# Patient Record
Sex: Male | Born: 1970 | Hispanic: No | Marital: Married | Smoking: Never smoker
Health system: Southern US, Community
[De-identification: ages and names within clinical notes are randomized; demographics above are authoritative.]

## PROBLEM LIST (undated history)

## (undated) ENCOUNTER — Ambulatory Visit (HOSPITAL_COMMUNITY): Admission: EM | Payer: Medicaid Other

## (undated) DIAGNOSIS — N400 Enlarged prostate without lower urinary tract symptoms: Secondary | ICD-10-CM

## (undated) DIAGNOSIS — B372 Candidiasis of skin and nail: Secondary | ICD-10-CM

## (undated) DIAGNOSIS — I10 Essential (primary) hypertension: Secondary | ICD-10-CM

## (undated) DIAGNOSIS — K920 Hematemesis: Secondary | ICD-10-CM

## (undated) DIAGNOSIS — E119 Type 2 diabetes mellitus without complications: Secondary | ICD-10-CM

## (undated) DIAGNOSIS — M5116 Intervertebral disc disorders with radiculopathy, lumbar region: Secondary | ICD-10-CM

## (undated) DIAGNOSIS — N529 Male erectile dysfunction, unspecified: Secondary | ICD-10-CM

## (undated) DIAGNOSIS — R350 Frequency of micturition: Secondary | ICD-10-CM

## (undated) DIAGNOSIS — Z0289 Encounter for other administrative examinations: Secondary | ICD-10-CM

## (undated) HISTORY — DX: Encounter for other administrative examinations: Z02.89

## (undated) HISTORY — DX: Intervertebral disc disorders with radiculopathy, lumbar region: M51.16

## (undated) HISTORY — DX: Benign prostatic hyperplasia without lower urinary tract symptoms: N40.0

## (undated) HISTORY — DX: Frequency of micturition: R35.0

## (undated) HISTORY — DX: Candidiasis of skin and nail: B37.2

## (undated) HISTORY — DX: Hematemesis: K92.0

## (undated) HISTORY — DX: Male erectile dysfunction, unspecified: N52.9

---

## 2015-08-05 ENCOUNTER — Emergency Department (HOSPITAL_COMMUNITY): Payer: Medicaid Other

## 2015-08-05 ENCOUNTER — Emergency Department (HOSPITAL_COMMUNITY)
Admission: EM | Admit: 2015-08-05 | Discharge: 2015-08-05 | Disposition: A | Discharge status: Home / Self Care | Payer: Medicaid Other | Attending: Emergency Medicine | Admitting: Emergency Medicine

## 2015-08-05 ENCOUNTER — Encounter (HOSPITAL_COMMUNITY): Payer: Self-pay | Admitting: Emergency Medicine

## 2015-08-05 DIAGNOSIS — K920 Hematemesis: Secondary | ICD-10-CM | POA: Diagnosis present

## 2015-08-05 DIAGNOSIS — R1033 Periumbilical pain: Secondary | ICD-10-CM | POA: Insufficient documentation

## 2015-08-05 DIAGNOSIS — R05 Cough: Secondary | ICD-10-CM | POA: Insufficient documentation

## 2015-08-05 DIAGNOSIS — R52 Pain, unspecified: Secondary | ICD-10-CM

## 2015-08-05 DIAGNOSIS — R109 Unspecified abdominal pain: Secondary | ICD-10-CM

## 2015-08-05 LAB — CBC WITH DIFFERENTIAL/PLATELET
BASOS ABS: 0 10*3/uL (ref 0.0–0.1)
Basophils Relative: 0 % (ref 0–1)
Eosinophils Absolute: 0.1 10*3/uL (ref 0.0–0.7)
Eosinophils Relative: 2 % (ref 0–5)
HCT: 43.9 % (ref 39.0–52.0)
Hemoglobin: 14.5 g/dL (ref 13.0–17.0)
LYMPHS PCT: 26 % (ref 12–46)
Lymphs Abs: 2.2 10*3/uL (ref 0.7–4.0)
MCH: 27.4 pg (ref 26.0–34.0)
MCHC: 33 g/dL (ref 30.0–36.0)
MCV: 82.8 fL (ref 78.0–100.0)
Monocytes Absolute: 0.4 10*3/uL (ref 0.1–1.0)
Monocytes Relative: 5 % (ref 3–12)
NEUTROS PCT: 67 % (ref 43–77)
Neutro Abs: 5.6 10*3/uL (ref 1.7–7.7)
Platelets: 273 10*3/uL (ref 150–400)
RBC: 5.3 MIL/uL (ref 4.22–5.81)
RDW: 13.5 % (ref 11.5–15.5)
WBC: 8.3 10*3/uL (ref 4.0–10.5)

## 2015-08-05 LAB — URINALYSIS, ROUTINE W REFLEX MICROSCOPIC
BILIRUBIN URINE: NEGATIVE
GLUCOSE, UA: NEGATIVE mg/dL
Ketones, ur: NEGATIVE mg/dL
Leukocytes, UA: NEGATIVE
NITRITE: NEGATIVE
PROTEIN: NEGATIVE mg/dL
Specific Gravity, Urine: 1.022 (ref 1.005–1.030)
UROBILINOGEN UA: 0.2 mg/dL (ref 0.0–1.0)
pH: 5 (ref 5.0–8.0)

## 2015-08-05 LAB — COMPREHENSIVE METABOLIC PANEL
ALBUMIN: 3.9 g/dL (ref 3.5–5.0)
ALK PHOS: 84 U/L (ref 38–126)
ALT: 25 U/L (ref 17–63)
ANION GAP: 8 (ref 5–15)
AST: 21 U/L (ref 15–41)
BILIRUBIN TOTAL: 0.3 mg/dL (ref 0.3–1.2)
BUN: 10 mg/dL (ref 6–20)
CHLORIDE: 105 mmol/L (ref 101–111)
CO2: 27 mmol/L (ref 22–32)
CREATININE: 0.74 mg/dL (ref 0.61–1.24)
Calcium: 10.2 mg/dL (ref 8.9–10.3)
GFR calc Af Amer: >60 mL/min (ref >60)
GFR calc non Af Amer: >60 mL/min (ref >60)
GLUCOSE: 150 mg/dL — AB (ref 65–99)
POTASSIUM: 3.7 mmol/L (ref 3.5–5.1)
SODIUM: 140 mmol/L (ref 135–145)
TOTAL PROTEIN: 7.1 g/dL (ref 6.5–8.1)

## 2015-08-05 LAB — TROPONIN I

## 2015-08-05 LAB — URINE MICROSCOPIC-ADD ON

## 2015-08-05 LAB — LIPASE, BLOOD: Lipase: 18 U/L — ABNORMAL LOW (ref 22–51)

## 2015-08-05 MED ORDER — ONDANSETRON 4 MG PO TBDP
ORAL_TABLET | ORAL | Status: AC
  Filled 2015-08-05: qty 1

## 2015-08-05 MED ORDER — ONDANSETRON 4 MG PO TBDP
4.0000 mg | ORAL_TABLET | Freq: Once | ORAL | Status: AC
  Administered 2015-08-05: 4 mg via ORAL

## 2015-08-05 MED ORDER — IOHEXOL 300 MG/ML  SOLN
100.0000 mL | Freq: Once | INTRAMUSCULAR | Status: AC | PRN
  Administered 2015-08-05: 100 mL via INTRAVENOUS

## 2015-08-05 MED ORDER — ONDANSETRON HCL 8 MG PO TABS: 8.0000 mg | ORAL_TABLET | Freq: Three times a day (TID) | ORAL | Status: DC | PRN

## 2015-08-05 MED ORDER — IOHEXOL 300 MG/ML  SOLN
25.0000 mL | Freq: Once | INTRAMUSCULAR | Status: AC | PRN
Start: 2015-08-05 — End: 2015-08-05
  Administered 2015-08-05: 25 mL via ORAL

## 2015-08-05 NOTE — ED Provider Notes (Signed)
CSN: [id number]     Arrival date & time 08/05/15  1221 History   First MD Initiated Contact with Patient 08/05/15 1733     Chief Complaint  Patient presents with  . Hematemesis  . Cough     (Consider location/radiation/quality/duration/timing/severity/associated sxs/prior Treatment) HPI Patient speaks no English history is obtained through professional interpreter who accompanies him. A medical interpreter was offered to the patient which he declined. Complains of periumbilical abdominal pain for the past 8 days with several episodes of vomiting. He vomited food today mixed with a few spots of blood. He denies dizziness. Is presently hungry. Pain is mild at present. Nonradiating. He complains of cough only with air conditioning. Denies shortness of breath. No fever. No treatment prior to coming here. Nothing makes symptoms better or worse. No urinary symptoms. No other associated symptoms. History reviewed. No pertinent past medical history. past medical history negative History reviewed. No pertinent past surgical history. past surgical history negative History reviewed. No pertinent family history. History  Substance Use Topics  . Smoking status: Never Smoker   . Smokeless tobacco: Not on file  . Alcohol Use: No   no illicit drug use  Review of Systems  Constitutional: Negative.   HENT: Negative.   Respiratory: Positive for cough.   Cardiovascular: Negative.   Gastrointestinal: Positive for vomiting and abdominal pain.  Musculoskeletal: Negative.   Skin: Negative.   Neurological: Negative.   Psychiatric/Behavioral: Negative.   All other systems reviewed and are negative.     Allergies  Review of patient's allergies indicates no known allergies.  Home Medications   Prior to Admission medications   Not on File   BP 139/85 mmHg  Pulse 64  Temp(Src) 98.4 F (36.9 C)  Resp 16  Ht 5\' 3"  (1.6 m)  Wt 180 lb (81.647 kg)  BMI 31.89 kg/m2  SpO2 98% Physical Exam   Constitutional: He appears well-developed and well-nourished.  HENT:  Head: Normocephalic and atraumatic.  Eyes: Conjunctivae are normal. Pupils are equal, round, and reactive to light.  Neck: Neck supple. No tracheal deviation present. No thyromegaly present.  Cardiovascular: Normal rate and regular rhythm.   No murmur heard. Pulmonary/Chest: Effort normal and breath sounds normal.  Abdominal: Soft. Bowel sounds are normal. He exhibits no distension. There is tenderness.  Periumbilical tenderness  Genitourinary: Penis normal.  Scrotum normal  Musculoskeletal: Normal range of motion. He exhibits no edema or tenderness.  Neurological: He is alert. Coordination normal.  Skin: Skin is warm and dry. No rash noted.  Psychiatric: He has a normal mood and affect.  Nursing note and vitals reviewed.   ED Course  Procedures (including critical care time) Labs Review Labs Reviewed  LIPASE, BLOOD - Abnormal; Notable for the following:    Lipase 18 (*)    All other components within normal limits  COMPREHENSIVE METABOLIC PANEL - Abnormal; Notable for the following:    Glucose, Bld 150 (*)    All other components within normal limits  CBC WITH DIFFERENTIAL/PLATELET    Imaging Review No results found.   EKG Interpretation None     8:30 PM patient asymptomatic pain-free  Results for orders placed or performed during the hospital encounter of 08/05/15  Lipase, blood  Result Value Ref Range   Lipase 18 (L) 22 - 51 U/L  Comprehensive metabolic panel  Result Value Ref Range   Sodium 140 135 - 145 mmol/L   Potassium 3.7 3.5 - 5.1 mmol/L   Chloride 105 101 - 111  mmol/L   CO2 27 22 - 32 mmol/L   Glucose, Bld 150 (H) 65 - 99 mg/dL   BUN 10 6 - 20 mg/dL   Creatinine, Ser 4.09 0.61 - 1.24 mg/dL   Calcium 81.1 8.9 - 91.4 mg/dL   Total Protein 7.1 6.5 - 8.1 g/dL   Albumin 3.9 3.5 - 5.0 g/dL   AST 21 15 - 41 U/L   ALT 25 17 - 63 U/L   Alkaline Phosphatase 84 38 - 126 U/L   Total  Bilirubin 0.3 0.3 - 1.2 mg/dL   GFR calc non Af Amer >60 >60 mL/min   GFR calc Af Amer >60 >60 mL/min   Anion gap 8 5 - 15  CBC with Differential  Result Value Ref Range   WBC 8.3 4.0 - 10.5 K/uL   RBC 5.30 4.22 - 5.81 MIL/uL   Hemoglobin 14.5 13.0 - 17.0 g/dL   HCT 78.2 95.6 - 21.3 %   MCV 82.8 78.0 - 100.0 fL   MCH 27.4 26.0 - 34.0 pg   MCHC 33.0 30.0 - 36.0 g/dL   RDW 08.6 57.8 - 46.9 %   Platelets 273 150 - 400 K/uL   Neutrophils Relative % 67 43 - 77 %   Neutro Abs 5.6 1.7 - 7.7 K/uL   Lymphocytes Relative 26 12 - 46 %   Lymphs Abs 2.2 0.7 - 4.0 K/uL   Monocytes Relative 5 3 - 12 %   Monocytes Absolute 0.4 0.1 - 1.0 K/uL   Eosinophils Relative 2 0 - 5 %   Eosinophils Absolute 0.1 0.0 - 0.7 K/uL   Basophils Relative 0 0 - 1 %   Basophils Absolute 0.0 0.0 - 0.1 K/uL  Urinalysis, Routine w reflex microscopic (not at [facility])  Result Value Ref Range   Color, Urine YELLOW YELLOW   APPearance CLEAR CLEAR   Specific Gravity, Urine 1.022 1.005 - 1.030   pH 5.0 5.0 - 8.0   Glucose, UA NEGATIVE NEGATIVE mg/dL   Hgb urine dipstick SMALL (A) NEGATIVE   Bilirubin Urine NEGATIVE NEGATIVE   Ketones, ur NEGATIVE NEGATIVE mg/dL   Protein, ur NEGATIVE NEGATIVE mg/dL   Urobilinogen, UA 0.2 0.0 - 1.0 mg/dL   Nitrite NEGATIVE NEGATIVE   Leukocytes, UA NEGATIVE NEGATIVE  Troponin I  Result Value Ref Range   Troponin I <0.03 <0.031 ng/mL  Urine microscopic-add on  Result Value Ref Range   Squamous Epithelial / LPF RARE RARE   WBC, UA 0-2 <3 WBC/hpf   RBC / HPF 0-2 <3 RBC/hpf   Bacteria, UA RARE RARE   Urine-Other MUCOUS PRESENT    Ct Abdomen Pelvis W Contrast  08/05/2015   CLINICAL DATA:  Periumbilical pain with nausea and vomiting for 8 days  EXAM: CT ABDOMEN AND PELVIS WITH CONTRAST  TECHNIQUE: Multidetector CT imaging of the abdomen and pelvis was performed using the standard protocol following bolus administration of intravenous contrast. Oral contrast was also administered.   CONTRAST:  OMNIPAQUE IOHEXOL 300 MG/ML  SOLN  COMPARISON:  None.  FINDINGS: Lung bases are clear.  Lesions are identified. Gallbladder wall is not appreciably thickened. There is no biliary duct dilatation.  Spleen, pancreas, and adrenals appear normal.  Kidneys bilaterally show no mass or hydronephrosis on either side. There is no renal or ureteral calculus on either side.  In the pelvis, the urinary bladder is midline with normal wall thickness. There is fat in each inguinal ring. There is a rather prominent medial umbilical  ligament extending from the urinary bladder to the umbilicus which appears benign. There is no pelvic mass or pelvic fluid collection. There is no periappendiceal region inflammation. There are scattered sigmoid diverticula without diverticulitis.  There is no bowel obstruction. Free air or portal venous air. There is no appreciable ascites, adenopathy, or abscess in the abdomen or pelvis. There is no abdominal aortic aneurysm. There is degenerative change at L5-S1 with vacuum phenomenon at this level. There are no blastic or lytic bone lesions.  IMPRESSION: No bowel obstruction. No abscess. Appendix region appears normal. No renal or ureteral calculus. No hydronephrosis. No umbilical region lesion is seen. There are sigmoid diverticula without diverticulitis.   Electronically Signed   By: Bretta Bang III M.D.   On: 08/05/2015 19:04    MDM  Plan prescription Zofran. Prilosec OTC as directed. Referral [facility]  Diagnoses #1 abdominal pain  #2 nausea vomiting  #3 hyperglycemia  Final diagnoses:  None        Doug Sou, MD 08/05/15 2040

## 2015-08-05 NOTE — Discharge Instructions (Signed)
Abdominal Pain Blood sugar today was 150. You may be borderline diabetic. Call the [facility] tomorrow to schedule next available appointment, and to get a primary care physician. Take Prilosec OTC as directed. Take the medicine prescribed as needed for nausea or vomiting. Return if your condition worsens for any reason Many things can cause abdominal pain. Usually, abdominal pain is not caused by a disease and will improve without treatment. It can often be observed and treated at home. Your health care provider will do a physical exam and possibly order blood tests and X-rays to help determine the seriousness of your pain. However, in many cases, more time must pass before a clear cause of the pain can be found. Before that point, your health care provider may not know if you need more testing or further treatment. HOME CARE INSTRUCTIONS  Monitor your abdominal pain for any changes. The following actions may help to alleviate any discomfort you are experiencing:  Only take over-the-counter or prescription medicines as directed by your health care provider.  Do not take laxatives unless directed to do so by your health care provider.  Try a clear liquid diet (broth, tea, or water) as directed by your health care provider. Slowly move to a bland diet as tolerated. SEEK MEDICAL CARE IF:  You have unexplained abdominal pain.  You have abdominal pain associated with nausea or diarrhea.  You have pain when you urinate or have a bowel movement.  You experience abdominal pain that wakes you in the night.  You have abdominal pain that is worsened or improved by eating food.  You have abdominal pain that is worsened with eating fatty foods.  You have a fever. SEEK IMMEDIATE MEDICAL CARE IF:   Your pain does not go away within 2 hours.  You keep throwing up (vomiting).  Your pain is felt only in portions of the abdomen, such as the right side or the left lower  portion of the abdomen.  You pass bloody or black tarry stools. MAKE SURE YOU:  Understand these instructions.   Will watch your condition.   Will get help right away if you are not doing well or get worse.  Document Released: 09/23/2005 Document Revised: 12/19/2013 Document Reviewed: 08/23/2013 [facility] Patient Information 2015 [place], [place]. This information is not intended to replace advice given to you by your health care provider. Make sure you discuss any questions you have with your health care provider.

## 2015-08-05 NOTE — ED Notes (Signed)
Pt given a sprite to drink ok per Dr Ethelda Chick

## 2015-08-05 NOTE — ED Notes (Signed)
Pt would not use a urinal insisted on putting on pants and walking to the bathroom

## 2015-08-05 NOTE — ED Notes (Signed)
Pt sts N/V x 8 days with some blood noted today; pt sts coughx 4 days and some abd pain

## 2015-08-05 NOTE — ED Notes (Signed)
Number for translator [phone]

## 2015-08-05 NOTE — ED Notes (Signed)
Pt hooked back up to the monitor with the 5 lead, BP cuff and pulse ox 

## 2015-08-05 NOTE — ED Notes (Signed)
Pt also sts dizziness worse with standing x 2 days

## 2015-08-21 ENCOUNTER — Encounter (HOSPITAL_COMMUNITY): Payer: Self-pay | Admitting: *Deleted

## 2015-08-21 ENCOUNTER — Emergency Department (HOSPITAL_COMMUNITY)
Admission: EM | Admit: 2015-08-21 | Discharge: 2015-08-21 | Disposition: A | Discharge status: Home / Self Care | Payer: Medicaid Other | Source: Home / Self Care | Attending: Family Medicine | Admitting: Family Medicine

## 2015-08-21 DIAGNOSIS — K2900 Acute gastritis without bleeding: Secondary | ICD-10-CM

## 2015-08-21 MED ORDER — GI COCKTAIL ~~LOC~~
30.0000 mL | Freq: Once | ORAL | Status: AC
  Administered 2015-08-21: 30 mL via ORAL

## 2015-08-21 MED ORDER — RANITIDINE HCL 150 MG PO TABS: 150.0000 mg | ORAL_TABLET | Freq: Two times a day (BID) | ORAL | Status: DC

## 2015-08-21 MED ORDER — GI COCKTAIL ~~LOC~~
ORAL | Status: AC
  Filled 2015-08-21: qty 30

## 2015-08-21 MED ORDER — SUCRALFATE 1 GM/10ML PO SUSP: 1.0000 g | Freq: Three times a day (TID) | ORAL | Status: DC

## 2015-08-21 NOTE — ED Notes (Addendum)
Pt  Reports  abd  Pain       Reports      abd  Pain        Wants  A  rx for    Medication   [employer]   Interpretors       Pt  Reports    No     diarhea               Pain is  Epigastric               In  [employer]

## 2015-08-21 NOTE — ED Provider Notes (Signed)
CSN: [id number]     Arrival date & time 08/21/15  1710 History   First MD Initiated Contact with Patient 08/21/15 1811     Chief Complaint  Patient presents with  . Abdominal Pain   (Consider location/radiation/quality/duration/timing/severity/associated sxs/prior Treatment) Patient is a 44 y.o. male presenting with abdominal pain. The history is provided by the patient and the spouse. The history is limited by a language barrier. A language interpreter was used.  Abdominal Pain Pain location:  Epigastric Pain quality: burning   Pain radiates to:  Does not radiate Pain severity:  Moderate Onset quality:  Gradual Duration:  20 days Progression:  Unchanged Chronicity:  New Context comment:  Seen in ER 8/8 but reports no meds given, here for same sx. Associated symptoms: no constipation, no diarrhea, no nausea and no vomiting     History reviewed. No pertinent past medical history. History reviewed. No pertinent past surgical history. History reviewed. No pertinent family history. Social History  Substance Use Topics  . Smoking status: Never Smoker   . Smokeless tobacco: None  . Alcohol Use: No    Review of Systems  Constitutional: Negative.   Gastrointestinal: Positive for abdominal pain. Negative for nausea, vomiting, diarrhea and constipation.       Denies etoh use.    Allergies  Review of patient's allergies indicates no known allergies.  Home Medications   Prior to Admission medications   Medication Sig Start Date End Date Taking? Authorizing Provider  ondansetron (ZOFRAN) 8 MG tablet Take 1 tablet (8 mg total) by mouth every 8 (eight) hours as needed for nausea or vomiting. 08/05/15   Doug Sou, MD  ranitidine (ZANTAC) 150 MG tablet Take 1 tablet (150 mg total) by mouth 2 (two) times daily. 08/21/15   Linna Hoff, MD  sucralfate (CARAFATE) 1 GM/10ML suspension Take 10 mLs (1 g total) by mouth 4 (four) times daily -  with meals and at bedtime. 08/21/15   Linna Hoff, MD   BP 127/69 mmHg  Pulse 94  Temp(Src) 98.1 F (36.7 C) (Oral)  Resp 18  SpO2 98% Physical Exam  Constitutional: He is oriented to person, place, and time. He appears well-developed and well-nourished. No distress.  Abdominal: Soft. Normal appearance and bowel sounds are normal. He exhibits no distension and no mass. There is no hepatosplenomegaly. There is tenderness in the epigastric area. There is no rigidity, no rebound, no guarding, no CVA tenderness, no tenderness at McBurney's point and negative Murphy's sign.  Neurological: He is alert and oriented to person, place, and time.  Skin: Skin is warm and dry.  Nursing note and vitals reviewed.   ED Course  Procedures (including critical care time) Labs Review Labs Reviewed - No data to display  Imaging Review No results found.   MDM   1. Acute gastritis without hemorrhage       Linna Hoff, MD 08/21/15 [phone]

## 2015-08-26 ENCOUNTER — Ambulatory Visit (INDEPENDENT_AMBULATORY_CARE_PROVIDER_SITE_OTHER): Payer: Medicaid Other | Admitting: Family Medicine

## 2015-08-26 ENCOUNTER — Encounter: Payer: Self-pay | Admitting: Family Medicine

## 2015-08-26 VITALS — BP 111/89 | HR 69 | Temp 98.3°F | Wt 192.4 lb

## 2015-08-26 DIAGNOSIS — R110 Nausea: Secondary | ICD-10-CM | POA: Diagnosis not present

## 2015-08-26 DIAGNOSIS — K920 Hematemesis: Secondary | ICD-10-CM

## 2015-08-26 MED ORDER — OMEPRAZOLE 40 MG PO CPDR: 40.0000 mg | DELAYED_RELEASE_CAPSULE | Freq: Every day | ORAL | Status: DC

## 2015-08-26 NOTE — Patient Instructions (Signed)
Thank you for coming to see me today. It was a pleasure. Today we talked about:   Blood in vomit: I will prescribe medication for you. If this does not help, please return. If you notice blood in your stool or your symptoms worsen, please return  If you have any questions or concerns, please do not hesitate to call the office at [phone].  Sincerely,  Jacquelin Hawking, MD

## 2015-08-26 NOTE — Progress Notes (Signed)
    Subjective   Lawrence Werner is a 44 y.o. male that presents for a same day visit  1. Hematemesis: Symptoms started about one month ago since he has been in the [place]. Patient has been seen in the ED and urgent care for this problem. He states he has symptoms of nausea and lightheadedness when the A/C is on. He states when he has food in his stomach, he vomits the food with blood and without food, he vomits just blood which he describes as small specks. He has not been taking the Zofran or Omeprazole. He has no associated abdominal pain or melena/hematechezia. So far, symptoms have remained constant. He currently works Publishing rights manager A/C units. He reports coughing when moving to/from areas of different temperatures that he has had for years. No fevers, night sweats, chills. No history of tuberculosis or known contact of TB.   ROS Per HPI  Social History  Substance Use Topics  . Smoking status: Never Smoker   . Smokeless tobacco: None  . Alcohol Use: No    No Known Allergies  Objective   BP 111/89 mmHg  Pulse 69  Temp(Src) 98.3 F (36.8 C) (Oral)  Wt 192 lb 6.4 oz (87.272 kg)  General: Well appearing, no distress HEENT: oropharynx clear Respiratory/Chest: Clear to auscultation without wheezing Cardiovascular: Regular rate and rhythm Gastrointestinal: Soft, non-tender, non-distended  Assessment and Plan   Meds ordered this encounter  Medications  . omeprazole (PRILOSEC) 40 MG capsule    Sig: Take 1 capsule (40 mg total) by mouth daily.    Dispense:  30 capsule    Refill:  3    Hematemesis with nausea Unsure of etiology. Hemoglobin has been stable. Preferred to recheck today but patient declines labs today. Symptoms probably related to GI and possible ulcer. No signs of LGI bleed. Will trial on PPI. Follow-up with PCP. May eventually need an EGD but patient would like to try medication management first.

## 2015-08-31 DIAGNOSIS — K920 Hematemesis: Secondary | ICD-10-CM | POA: Insufficient documentation

## 2015-08-31 HISTORY — DX: Hematemesis: K92.0

## 2015-08-31 NOTE — Assessment & Plan Note (Signed)
Unsure of etiology. Hemoglobin has been stable. Preferred to recheck today but patient declines labs today. Symptoms probably related to GI and possible ulcer. No signs of LGI bleed. Will trial on PPI. Follow-up with PCP. May eventually need an EGD but patient would like to try medication management first.

## 2015-09-18 ENCOUNTER — Ambulatory Visit (INDEPENDENT_AMBULATORY_CARE_PROVIDER_SITE_OTHER): Payer: Medicaid Other | Admitting: Family Medicine

## 2015-09-18 ENCOUNTER — Ambulatory Visit: Payer: Medicaid Other

## 2015-09-18 VITALS — BP 120/80 | HR 81 | Temp 98.2°F | Ht 68.5 in | Wt 194.1 lb

## 2015-09-18 DIAGNOSIS — M5442 Lumbago with sciatica, left side: Secondary | ICD-10-CM | POA: Diagnosis not present

## 2015-09-18 DIAGNOSIS — M5441 Lumbago with sciatica, right side: Secondary | ICD-10-CM

## 2015-09-18 DIAGNOSIS — M545 Low back pain: Secondary | ICD-10-CM | POA: Diagnosis not present

## 2015-09-18 DIAGNOSIS — N529 Male erectile dysfunction, unspecified: Secondary | ICD-10-CM

## 2015-09-18 DIAGNOSIS — Z0289 Encounter for other administrative examinations: Secondary | ICD-10-CM

## 2015-09-18 DIAGNOSIS — Z008 Encounter for other general examination: Secondary | ICD-10-CM

## 2015-09-18 LAB — CBC WITH DIFFERENTIAL/PLATELET
Basophils Absolute: 0 10*3/uL (ref 0.0–0.1)
Basophils Relative: 0 % (ref 0–1)
EOS ABS: 0.3 10*3/uL (ref 0.0–0.7)
Eosinophils Relative: 3 % (ref 0–5)
HCT: 42.9 % (ref 39.0–52.0)
HEMOGLOBIN: 14.6 g/dL (ref 13.0–17.0)
LYMPHS ABS: 3.2 10*3/uL (ref 0.7–4.0)
Lymphocytes Relative: 37 % (ref 12–46)
MCH: 27.1 pg (ref 26.0–34.0)
MCHC: 34 g/dL (ref 30.0–36.0)
MCV: 79.6 fL (ref 78.0–100.0)
MONO ABS: 0.6 10*3/uL (ref 0.1–1.0)
MONOS PCT: 7 % (ref 3–12)
MPV: 9.6 fL (ref 8.6–12.4)
NEUTROS ABS: 4.6 10*3/uL (ref 1.7–7.7)
NEUTROS PCT: 53 % (ref 43–77)
Platelets: 322 10*3/uL (ref 150–400)
RBC: 5.39 MIL/uL (ref 4.22–5.81)
RDW: 13.8 % (ref 11.5–15.5)
WBC: 8.6 10*3/uL (ref 4.0–10.5)

## 2015-09-18 LAB — TSH: TSH: 1.026 u[IU]/mL (ref 0.350–4.500)

## 2015-09-18 MED ORDER — CYCLOBENZAPRINE HCL 5 MG PO TABS: 5.0000 mg | ORAL_TABLET | Freq: Three times a day (TID) | ORAL | Status: DC | PRN

## 2015-09-18 MED ORDER — TRAMADOL HCL 50 MG PO TABS: 50.0000 mg | ORAL_TABLET | Freq: Three times a day (TID) | ORAL | Status: DC | PRN

## 2015-09-18 NOTE — Progress Notes (Signed)
Video interpreter utilized during today's visit  [facility]  HPI:  Patient presents to [facility] today for a new patient appointment to establish general primary care, also to discuss vaccines prior to getting green card.  Also with the following:  1.  Dizziness and hematemesis:  Presented to Emergency Department and Urgent Care several times in the past for the same.   - Started on Zofran and omeprazole which has helped.  No dizziness or vomiting since that time.    2.  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  Pain started Feb 2016 while in [place], was told he had "disc prolapse" and recommended surgery.  Did not want to go through with this.  Was started on unknown analgesic, he stopped taking secondary to abdominal pain and nausea.  Has been using OTC creams for back pain with inconsistent relief.  Pain worse since arrival in [place].  Worse with standing, cold weather.  Describes as sharp, stabbing pain.  No fevers or chills.  Does describe increased urinary urgency.  Also increased bowel urgency.  No saddle anesthesia   ROS: Also with some erectile dysfunction which has been worsening.   otherwise see HPI  Past Medical Hx:  - as above, no other diagnosed conditions.   - was told he's had "high blood sugar" in past.    Past Surgical Hx:  -denies   Family Hx: updated in Epic -- Number of family members in [place]:  Wife and boy  Immigrant Social History: - Date arrived in [place]: August 2016.   - Country of origin: [place] - Location of refugee camp (if applicable), how long there, and what caused patient to leave home country?: [place] - Primary language: Arabic  -Requires intepreter (essentially speaks no [place]) - Education: Highest level of education: 7th grade - Prior work: previously did Youth worker  - Tobacco/alcohol/drug use: denies - Marriage Status: married  - Sexual activity: yes - Class B conditions: n/a - Were you beaten or tortured in your country  or refugee camp?  denies  - if yes:  Are you having bad dreams about your experience?     Do you feel "jumpy" or "nervous?"     Do you feel that the experience is happening again?     Are you "super alert" or watchful?   Preventative Care History: -Seen at health department?: NO  PHYSICAL EXAM: BP 120/80 mmHg  Pulse 81  Temp(Src) 98.2 F (36.8 C) (Oral)  Ht 5' 8.5" (1.74 m)  Wt 194 lb 1.6 oz (88.043 kg)  BMI 29.08 kg/m2 Gen:  Alert, cooperative patient who appears stated age in no acute distress.  Vital signs reviewed. HEENT:  [place]/AT.  EOMI, PERRL.  MMM, tonsils non-erythematous, non-edematous.  External ears WNL, Bilateral TM's normal without retraction, redness or bulging.  Neck: No masses or thyromegaly or limitation in range of motion.  No cervical lymphadenopathy. Cardiac:  Regular rate and rhythm without murmur auscultated.  Good S1/S2. Pulm:  Clear to auscultation bilaterally with good air movement.  No wheezes or rales noted.   Abd:  Soft/nondistended/nontender.  Good bowel sounds throughout all four quadrants.  No masses noted.  Ext:  No clubbing/cyanosis/erythema.  No edema noted bilateral lower extremities.   Back:  Normal skin, Spine with normal alignment and no deformity.  No tenderness to vertebral process palpation.  Paraspinous muscles tender and with spasm BL lumbar regions.   Range of motion is full at neck and lumbar  sacral regions.  Straight leg raise is positive both sides for both ipsilateral and contralateral pain.  Neuro:  Alert and oriented.  Sensation and motor function 5/5 bilateral lower extremities.  Patellar and Achilles  DTR's +2 patellar BL.  He is able to walk on his heels and toes without difficulty.  Psych:  Pleasant and alert, conversant.  Not depressed appearing.

## 2015-09-18 NOTE — Patient Instructions (Addendum)
Take the Tramadol for pain relief.  Take the Flexeril for muscle relaxer.    MRI for back.

## 2015-09-19 LAB — HIV ANTIBODY (ROUTINE TESTING W REFLEX): HIV: NONREACTIVE

## 2015-09-19 LAB — HEPATITIS B SURFACE ANTIGEN: Hepatitis B Surface Ag: NEGATIVE

## 2015-09-19 LAB — RPR

## 2015-09-19 LAB — VARICELLA ZOSTER ANTIBODY, IGG: Varicella IgG: 815.5 Index — ABNORMAL HIGH (ref <135.00)

## 2015-09-20 DIAGNOSIS — Z0289 Encounter for other administrative examinations: Secondary | ICD-10-CM | POA: Insufficient documentation

## 2015-09-20 DIAGNOSIS — M5116 Intervertebral disc disorders with radiculopathy, lumbar region: Secondary | ICD-10-CM

## 2015-09-20 DIAGNOSIS — N529 Male erectile dysfunction, unspecified: Secondary | ICD-10-CM

## 2015-09-20 HISTORY — DX: Male erectile dysfunction, unspecified: N52.9

## 2015-09-20 HISTORY — DX: Intervertebral disc disorders with radiculopathy, lumbar region: M51.16

## 2015-09-20 HISTORY — DX: Encounter for other administrative examinations: Z02.89

## 2015-09-20 NOTE — Assessment & Plan Note (Signed)
Awaiting labs from health department. Otherwise he is doing well except for back pain and erectile dysfunction.

## 2015-09-20 NOTE — Assessment & Plan Note (Addendum)
Present for years. Had MRI which showed disc bulge while in camp in [place].  Has some weakness. Concerning for stenosis -- plan MRI  Tramadol and Flexeril for pain relief.

## 2015-09-20 NOTE — Assessment & Plan Note (Signed)
No other urinary complaints.  Checking MRI before treatment.   Likely psychogenic.

## 2015-09-24 ENCOUNTER — Telehealth: Payer: Self-pay | Admitting: Family Medicine

## 2015-09-24 NOTE — Telephone Encounter (Signed)
Hlat Lo from the health dept called and would like a copy of the patient office visit and also the labs from 09/18/15 faxed to her at [phone]. jw °

## 2015-09-25 ENCOUNTER — Ambulatory Visit: Payer: Medicaid Other

## 2015-09-27 ENCOUNTER — Ambulatory Visit (HOSPITAL_COMMUNITY)
Admission: RE | Admit: 2015-09-27 | Discharge: 2015-09-27 | Disposition: A | Discharge status: Home / Self Care | Payer: Medicaid Other | Source: Ambulatory Visit | Attending: Family Medicine | Admitting: Family Medicine

## 2015-09-27 DIAGNOSIS — M5126 Other intervertebral disc displacement, lumbar region: Secondary | ICD-10-CM | POA: Insufficient documentation

## 2015-09-27 DIAGNOSIS — M5442 Lumbago with sciatica, left side: Secondary | ICD-10-CM | POA: Insufficient documentation

## 2015-09-27 DIAGNOSIS — M2578 Osteophyte, vertebrae: Secondary | ICD-10-CM | POA: Diagnosis not present

## 2015-09-27 DIAGNOSIS — M5441 Lumbago with sciatica, right side: Secondary | ICD-10-CM | POA: Diagnosis not present

## 2015-10-03 NOTE — Telephone Encounter (Signed)
I would double check with Dr Gwendolyn Grant but I suspect yes. This family was seen in our intake refugee clinic.

## 2015-10-04 ENCOUNTER — Encounter: Payer: Self-pay | Admitting: Family Medicine

## 2015-10-07 NOTE — Telephone Encounter (Signed)
Printed and faxed as requested. Lawrence Werner, Lawrence Werner

## 2015-10-07 NOTE — Telephone Encounter (Signed)
   10/04/2015 4:43 PM Tobey Grim, MD       Comment: Please send all of the information for which they are asking. Thanks so much!

## 2015-10-21 ENCOUNTER — Encounter: Payer: Self-pay | Admitting: Family Medicine

## 2015-10-21 ENCOUNTER — Ambulatory Visit (INDEPENDENT_AMBULATORY_CARE_PROVIDER_SITE_OTHER): Payer: Medicaid Other | Admitting: Family Medicine

## 2015-10-21 VITALS — BP 135/97 | HR 81 | Temp 98.3°F | Wt 194.0 lb

## 2015-10-21 DIAGNOSIS — M5116 Intervertebral disc disorders with radiculopathy, lumbar region: Secondary | ICD-10-CM | POA: Diagnosis not present

## 2015-10-21 MED ORDER — PREDNISONE 10 MG PO TABS: ORAL_TABLET | ORAL | Status: DC

## 2015-10-21 MED ORDER — OXYCODONE-ACETAMINOPHEN 5-325 MG PO TABS: 1.0000 | ORAL_TABLET | Freq: Four times a day (QID) | ORAL | Status: DC | PRN

## 2015-10-21 NOTE — Patient Instructions (Addendum)
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 I have started treating him with narcotic pain medicine and prednisone steroid for reducing inflammation. I have referred him to an Orthopedic Spine Surgeon for further evaluation, as he may need surgery once his pain improves. I recommend that he be excused from work for up to 2 weeks, he may be out of work from today 10/21/15 through 11/03/15, and he may return to work on 11/04/15. He will return to our office first and follow-up to determine if he needs longer time out of work.  Take percocet as needed for pain, use prednisone as prescribed.  You should get a call from Spine Surgeon office for appointment  May try heating pad and advise to avoid strenuous activities  Take 4 tablets ( ) for 4 days, then 3 tab ( ) for 4 days, then 2 tab ( ) for 4 days, then 1 tab ( ) for 4 days  Please schedule a follow-up appointment with Dr. Caleb Popp in 2 weeks to follow-up Back Pain and determine if / when can return to work  If you have any other questions or concerns, please feel free to call the clinic to contact me. You may also schedule an earlier appointment if necessary.  However, if your symptoms get significantly worse, please go to the Emergency Department to seek immediate medical attention.  Saralyn Pilar, DO [facility]                               .           .                     .         2           10/21/15   11/03/15       11/04/15.                  Lawrence Werner   percocet        .                         4  ( )  4   3  ( )  4   2  ( )  4   1  ( )  4         Nettey  2        /                      .        Althea Charon DO     ladayh haddat ealaa aindilae almuzman min alam 'asfal alzzuhr mae alshshuki waltihab almafasil alqars yusabbib aleasabiat daght Lawrence Werner qad [id number] lah alamaan shadidatan tamtadd 'iilaa kl min saqayh alhadd min qudratih ealaa alwuquf 'aw alainhina' lifatarat tawilat bsbb al'alam. laqad bada'at mueamalatah mae latb al'alam almukhaddirat walmunshitat bridnyzwn litaqlil alaltihabi. laqad 'ashart lah 'iilaa aleizam aleumud alfaqri jirahatan limazid min alttaqayiimi, kama 'annah qad la yahtaj lieamaliat jirahiat bimujrad tahassun 'almih. 'Lawrence Werner 'annah 'iiefa'uh min aleamal limuddat  tasil 'iilaa 2 'asabiie, waqal 'annah qad yakun min eamal min alyawm 10/21/15 min khilal 11/03/15, wa'annah qad yaeud 'iilaa aleamal fi 11/04/15. waqal 'annah sayaeud 'iilaa maktabana awla walmutabaeat litahdid ma 'iidha kan yahtaj 'iilaa waqt 'atwal lilkhuruj min aleamal. Lawrence Werner percocet hsb alhajat lil'alm, waistikhdam baridnizun alnnahw almnsus ealayh. yjb 'an tahsul ealaa daewat min aleumud alfaqri jarah maktab lilttaeyin qad yuhawil sadat  alttadfiat wataqdim almashurat litajannub al'anshitat alshshaqq yastaghriq 4 'aqras (40MG ) limuddat 4 'ayam, thumm 3 alttabwib (30mg ) limuddat 4 'ayam, thumm 2 alttabwib (20mg ) limuddat 4 'ayam, thumm 1 alttabwib (10mg ) limuddat 4 'ayam yrja judwalat maweid lilmutabaeat mae aldduktur Nettey fi 2 'asabie limutabaeat alam alzzuhr watahdid ma 'iidha / eindama yumkin aleawdat 'iilaa aleamal 'iidha kan ladayk 'ay 'asyilat 'aw aistifsarat 'ukhraa, fala tataraddad fi alaittisal eiadat fi alaittisal bi. yumkinuk 'aydaan tahdid maweid sabiq 'iidha lazm al'amr. wamae dhallik, 'iidha al'aerad su'ana bishakl mmulhuwwizin, yrja Lawrence Werner 'iilaa qism alttawari lilhusul ealaa eanayat tibbiat fawriat. 'aliksandr Kenitra Leventhal DO makhrut alsshhat tb Lawrence Werner

## 2015-10-21 NOTE — Assessment & Plan Note (Signed)
Consistent with subacute on chronic LBP (since 2008, current flare since 01/2015 and now acute worse within 1 week) with associated bilateral sciatica, likely due to Lumbar L5-S1 and L4-L5 DJD with disc disease and mild encroachment on L5 nerve root also with L4-L5 disc protrusion without nerve impingement (dx on Lumbar MRI 09/27/15). Without known injury or trauma. No prior surgeries. Clinically without any evidence of cauda equina syndrome. 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 With nerve encroachment / radiculopathy symptoms start anti-inflammatory Prednisone taper as prescribed for 16 days 2. Start stronger analgesia with opiates - Percocet 5/325 q 6 hr PRN #60 with 0 refills printed x 1 3. Discontinued Tramadol and Flexeril 4. Urgent referral to Spine Surgery (Orthopedics) for further follow-up now that patient has had MRI. Will treat acute flare and goal of Ortho follow-up to discuss potential future surgery, given multiple prior recommendations. Apt arranged in office with assistance of Tia Hill (as pt speaks only Arabic) 5. Written note out of work for up to 2 weeks (x 2 letters with details on condition given to patient) 6. RTC 2 weeks to follow-up with PCP to determine if can return to work or not. Ultimately discussed once acute pain improved, may need other therapies including PT if not proceeding with surgery 7. Return criteria given when to go to ED

## 2015-10-21 NOTE — Progress Notes (Signed)
HPI Review of Systems  Physical Exam  Duplicate note -

## 2015-10-21 NOTE — Progress Notes (Signed)
 Subjective:    Patient ID: Lawrence Lawrence, male    DOB: 08/23/1971, 44 y.o.   MRN: [id number]  Lawrence Werner is a 44 y.o. male presenting on 10/21/2015 for Back Pain  Patient presents for a same day appointment.  History provided by Arabic Video Interpreter Khaled [id number]  HPI   BACK PAIN, LOW BACK CHRONIC, FOLLOW-UP: -  - Last OV at [facility] on 09/18/15 for same complaint of LBP with radiculopathy at that time. Previous interval history of reported pain starting in Low Back since February 2016 (while back in [place]) without any injury or accident, was previously diagnosed in [place] with a "disc prolapse" advised surgery but he did not want to proceed. Additionally he reports a similar episode of Low Back Pain in 2008 back in [place], again denies any acute injury, trauma, or accident, stated he was "treated with cortisone" and thinks it may give him "osteoporosis" and "afraid to take it again". - Last imaging with Lumbar MRI (9/30) with "L5-S1 broad-based disc osteophyte complex. Foraminal extension greater on the right with mild encroachment upon the exiting right L5 nerve root. Centrally, disc osteophyte with mild contact with the upper S1 nerve roots slightly greater on the left. L4-5 small right foraminal protrusion approaches but does not compress the exiting right L4 nerve root" - Today following up on persistent low back pain and requests MRI results. He states severe low back pain, 9 to 10/10, seems persistent, worse over past 2 days, describes a constant throbbing and burning pain in low back with radiation into both legs down side/back of legs into feet, difficulty standing standing up straight due to pain, at home uses wall or sofa to help walk. 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 Request note off work for now. - Tried Tramadol and Flexeril regularly up to TID for 2 weeks without any relief, now self discontinued and not taking any OTC medications.  No past medical history on file.  Social History   Social History  . Marital Status: Married    Spouse Name: N/A  . Number of Children: N/A  . Years of Education: N/A   Occupational History  . Not on file.   Social History Main Topics  . Smoking status: Never Smoker   . Smokeless tobacco: Not on file  . Alcohol Use: No  . Drug Use: No  . Sexual Activity: Not on file   Other Topics Concern  . Not on file   Social History Narrative    Current Outpatient Prescriptions on File Prior to Visit  Medication Sig  . omeprazole (PRILOSEC) 40 MG capsule Take 1 capsule (40 mg total) by mouth daily.  . ondansetron (ZOFRAN) 8 MG tablet Take 1 tablet (8 mg total) by mouth every 8 (eight) hours as needed for nausea or vomiting.   No current facility-administered medications on file prior to visit.    Review of Systems  Constitutional: Positive for activity change (unable to work due to pain). Negative for fever, chills, diaphoresis and fatigue.  Eyes: Negative for visual disturbance.  Gastrointestinal: Negative for diarrhea and constipation.  Genitourinary: Negative for urgency, frequency, decreased urine volume and difficulty urinating.  Musculoskeletal: Positive for back pain (low lumbar, with radiating pain to bilateral legs), arthralgias and gait problem.   Negative for myalgias, joint swelling and neck pain.  Skin: Negative for rash.  Neurological: Negative for weakness and numbness (no saddle anesthesia or lower ext numbness).   Per HPI unless specifically indicated above     Objective:    BP 135/97 mmHg  Pulse 81  Temp(Src) 98.3 F (36.8 C) (Oral)  Wt 194 lb (87.998 kg)  Wt Readings from Last 3 Encounters:  10/21/15 194 lb (87.998 kg)  09/18/15 194 lb 1.6 oz (88.043 kg)  08/26/15 192 lb 6.4 oz  (87.272 kg)    Physical Exam  Constitutional: He is oriented to person, place, and time. He appears well-developed and well-nourished. No distress.  HENT:  Head: Normocephalic and atraumatic.  Neck: Normal range of motion. Neck supple.  Cardiovascular: Normal rate, regular rhythm, normal heart sounds and intact distal pulses.   No murmur heard. Pulmonary/Chest: Effort normal and breath sounds normal.  Musculoskeletal: He exhibits no edema.       Lumbar back: He exhibits decreased range of motion, tenderness (bilateral parapsinal lumbar L4-L5 tenderness to palpation, non-tender over spinous processes), pain and spasm. He exhibits no bony tenderness, no swelling, no edema and no deformity.  Seated SLR with positive radicular pain bilaterally. Muscle strength intact 5/5 with hip flex, ext, knee flex / ext, ankle dorsi/plantar flex. Some limited cooperation with str testing due to pain.  Neurological: He is alert and oriented to person, place, and time. He has normal reflexes.  Patellar and achilles DTR intact. Able to ambulate on heels and toes. Distal sensation to light touch intact bilaterally  Skin: Skin is warm and dry. No rash noted. He is not diaphoretic.  Nursing note and vitals reviewed.  Results for orders placed or performed in visit on 09/18/15  CBC with Differential/Platelet  Result Value Ref Range   WBC 8.6 4.0 - 10.5 K/uL   RBC 5.39 4.22 - 5.81 MIL/uL   Hemoglobin 14.6 13.0 - 17.0 g/dL   HCT 42.9 39.0 - 52.0 %   MCV 79.6 78.0 - 100.0 fL   MCH 27.1 26.0 - 34.0 pg   MCHC 34.0 30.0 - 36.0 g/dL   RDW 13.8 11.5 - 15.5 %   Platelets 322 150 - 400 K/uL   MPV 9.6 8.6 - 12.4 fL   Neutrophils Relative % 53 43 - 77 %   Neutro Abs 4.6 1.7 - 7.7 K/uL   Lymphocytes Relative 37 12 - 46 %   Lymphs Abs 3.2 0.7 - 4.0 K/uL   Monocytes Relative 7 3 - 12 %   Monocytes Absolute 0.6 0.1 - 1.0 K/uL   Eosinophils Relative 3 0 - 5 %   Eosinophils Absolute 0.3 0.0 - 0.7 K/uL   Basophils  Relative 0 0 - 1 %   Basophils Absolute 0.0 0.0 - 0.1 K/uL   Smear Review Criteria for review not met   TSH  Result Value Ref Range   TSH 1.026 0.350 - 4.500 uIU/mL  Varicella zoster antibody, IgG  Result Value Ref Range   Varicella IgG 815.50 (H) <135.00 Index  Hepatitis B surface antigen  Result Value Ref Range   Hepatitis B Surface Ag NEGATIVE NEGATIVE  RPR  Result Value Ref Range   RPR Ser Ql NON REAC NON REAC  HIV antibody  Result Value Ref Range   HIV 1&2 Ab, 4th Generation NONREACTIVE NONREACTIVE      Assessment & Plan:   Problem List Items Addressed This Visit      Nervous and Auditory  Lumbar disc disease with radiculopathy - Primary    Consistent with subacute on chronic LBP (since 2008, current flare since 01/2015 and now acute worse within 1 week) with associated bilateral sciatica, likely due to Lumbar L5-S1 and L4-L5 DJD with disc disease and mild encroachment on L5 nerve root also with L4-L5 disc protrusion without nerve impingement (dx on Lumbar MRI 09/27/15). Without known injury or trauma. No prior surgeries. Clinically without any evidence of cauda equina syndrome. - Inadequate therapy (self discontinued Tramadol and Flexeril few weeks ago) and not tried any conservative therapy - Unable to work as a painter  Plan: 1. With nerve encroachment / radiculopathy symptoms start anti-inflammatory Prednisone taper as prescribed for 16 days 2. Start stronger analgesia with opiates - Percocet 5/325 q 6 hr PRN #60 with 0 refills printed x 1 3. Discontinued Tramadol and Flexeril 4. Urgent referral to Spine Surgery (Orthopedics) for further follow-up now that patient has had MRI. Will treat acute flare and goal of Ortho follow-up to discuss potential future surgery, given multiple prior recommendations. Apt arranged in office with assistance of Tia Hill (as pt speaks only Arabic) 5. Written note out of work for up to 2 weeks (x 2 letters with details on condition given to  patient) 6. RTC 2 weeks to follow-up with PCP to determine if can return to work or not. Ultimately discussed once acute pain improved, may need other therapies including PT if not proceeding with surgery 7. Return criteria given when to go to ED       Relevant Medications   oxyCODONE-acetaminophen (ROXICET) 5-325 MG tablet   predniSONE (DELTASONE) 10 MG tablet   Other Relevant Orders   Ambulatory referral to Spine Surgery      Meds ordered this encounter  Medications  . oxyCODONE-acetaminophen (ROXICET) 5-325 MG tablet    Sig: Take 1 tablet by mouth every 6 (six) hours as needed for severe pain.    Dispense:  60 tablet    Refill:  0  . DISCONTD: predniSONE (DELTASONE) 10 MG tablet    Sig: Take 4 tablets (40mg) for 4 days, then 3 tab (30mg) for 4 days, then 2 tab (20mg) for 4 days, then 1 tab (10mg) for 4 days.    Dispense:  40 tablet    Refill:  0  . predniSONE (DELTASONE) 10 MG tablet    Sig: Take 4 tablets (40mg) for 4 days, then 3 tab (30mg) for 4 days, then 2 tab (20mg) for 4 days, then 1 tab (10mg) for 4 days.    Dispense:  40 tablet    Refill:  0      Follow up plan: Return in about 2 weeks (around 11/04/2015), or if symptoms worsen or fail to improve, for low back pain, nerve root compression.  A total of 25 minutes was spent face-to-face with this patient. Over half this time was spent on counseling patient on the diagnosis and different diagnostic and therapeutic options available.  Alexander Karamalegos, DO [facility], PGY-3 

## 2015-11-04 ENCOUNTER — Ambulatory Visit: Payer: Medicaid Other | Attending: Neurosurgery

## 2015-11-29 ENCOUNTER — Ambulatory Visit (INDEPENDENT_AMBULATORY_CARE_PROVIDER_SITE_OTHER): Payer: Medicaid Other | Admitting: Family Medicine

## 2015-11-29 ENCOUNTER — Encounter: Payer: Self-pay | Admitting: Family Medicine

## 2015-11-29 VITALS — BP 121/96 | HR 77 | Temp 98.0°F | Ht 68.5 in | Wt 203.5 lb

## 2015-11-29 DIAGNOSIS — M5116 Intervertebral disc disorders with radiculopathy, lumbar region: Secondary | ICD-10-CM | POA: Diagnosis not present

## 2015-11-29 NOTE — Patient Instructions (Addendum)
Thank you for coming to see me today. It was a pleasure. Today we talked about:   Back pain: I will refer you back to the neurosurgeon. If you have worsening pain, weakness or retention/incontinence, please go to the emergency department.  Please make an appointment to see me for follow-up in 3 months or sooner  If you have any questions or concerns, please do not hesitate to call the office at [phone].  Sincerely,  Jacquelin Hawkingalph Surya Schroeter, MD

## 2015-11-29 NOTE — Progress Notes (Signed)
    Subjective    Lawrence Werner is a 44 y.o. male that presents for a follow-up visit for chronic issues.   [employer] interpreter:   1. Back pain: [facility] and states medication declined by insurance. He is unsure of what medication he was prescribed. He last saw the neurosurgeon about 1.5 months ago. He reports running out of medication that he was prescribed by that doctor and has had severe pain since then. He reports pain radiating down both legs with weakness.  Social History  Substance Use Topics  . Smoking status: Never Smoker   . Smokeless tobacco: None  . Alcohol Use: No    No Known Allergies  No orders of the defined types were placed in this encounter.    ROS  Per HPI   Objective   BP 121/96 mmHg  Pulse 77  Temp(Src) 98 F (36.7 C) (Oral)  Ht 5' 8.5" (1.74 m)  Wt 203 lb 8 oz (92.307 kg)  BMI 30.49 kg/m2  General: Well appearing, no distress  Assessment and Plan    Lumbar disc disease with radiculopathy Patient does not want me to treat his symptoms. He would like referral back to neurosurgeon. Office called on 12/5 to set up appointment.

## 2015-12-02 ENCOUNTER — Telehealth: Payer: Self-pay | Admitting: Family Medicine

## 2015-12-02 NOTE — Telephone Encounter (Signed)
Called and left message asking staff to please make an appointment for patient. Patient requested this of me during our last office visit three days prior to today.

## 2015-12-03 NOTE — Telephone Encounter (Signed)
Called pt using [employer] interpre[employer]. Jasmine [phone]) left a message asking pt to call our office. Sunday SpillersSharon T Kylie Gros, CMA

## 2015-12-03 NOTE — Telephone Encounter (Signed)
Per Dr Lovell SheehanJenkins at [facility]:  Pt doesn't need surgery but pain mgt. They cannot refer because of medicaid.

## 2015-12-05 ENCOUNTER — Telehealth: Payer: Self-pay | Admitting: Family Medicine

## 2015-12-05 DIAGNOSIS — M5116 Intervertebral disc disorders with radiculopathy, lumbar region: Secondary | ICD-10-CM

## 2015-12-05 NOTE — Telephone Encounter (Signed)
Patient is [facility] to be [facility] for his back isse.  Please call his friend who speak English concerning this.  [phone]

## 2015-12-05 NOTE — Assessment & Plan Note (Signed)
Patient does not want me to treat his symptoms. He would like referral back to neurosurgeon. Office called on 12/5 to set up appointment.

## 2015-12-31 ENCOUNTER — Ambulatory Visit: Payer: Medicaid Other | Admitting: Physical Therapy

## 2016-01-01 ENCOUNTER — Ambulatory Visit: Payer: Medicaid Other | Attending: Neurosurgery | Admitting: Physical Therapy

## 2016-01-01 DIAGNOSIS — M5440 Lumbago with sciatica, unspecified side: Secondary | ICD-10-CM | POA: Diagnosis present

## 2016-01-01 NOTE — Therapy (Signed)
[facility] [address] [place], [place], [address] Phone: [phone]   Fax:  [phone]  Physical Therapy Evaluation  Patient Details  Name: Lawrence Werner MRN: [id number] Date of Birth: 1971/10/03 Referring Provider: Tressie Stalker, MD  Encounter Date: 01/01/2016      PT End of Session - 01/01/16 1507    Visit Number 1   Number of Visits 1   PT Start Time 1328   PT Stop Time 1408   PT Time Calculation (min) 40 min   Activity Tolerance Patient limited by pain   Behavior During Therapy [facility] for tasks assessed/performed      No past medical history on file.  No past surgical history on file.  There were no vitals filed for this visit.  Visit Diagnosis:  Midline low back pain with sciatica, sciatica laterality unspecified - Plan: PT plan of care cert/re-cert      Subjective Assessment - 01/01/16 1333    Subjective Pt is a 45 y/o male who presents to OPPT with chronic LBP affecting mobility.   Patient is accompained by: Interpreter  through pt's friend, see below   Limitations Sitting;Standing;Walking   How long can you stand comfortably? 1 min   How long can you walk comfortably? walking is uncomfortable "no more than 15 min"   Patient Stated Goals to improve pain; mobility with plan to try and find employment   Currently in Pain? Yes   Pain Score --  did not rate   Pain Location Back   Pain Orientation Lower   Pain Radiating Towards ant thighs and gastroc to feet   Pain Onset More than a month ago   Pain Frequency Constant   Aggravating Factors  standing, walking   Pain Relieving Factors sitting; respositioning            [facility] PT Assessment - 01/01/16 1339    Assessment   Medical Diagnosis LBP   Referring Provider Tressie Stalker, MD   Next MD Visit unknown   Prior Therapy 3 years ago   Precautions   Precautions None   Restrictions   Weight Bearing Restrictions No   Balance Screen   Has the patient fallen in the past 6 months No   Prior Function   Level of Independence Independent   Vocation Unemployed   Vocation Requirements refugee; hoping to obtain employment   AROM   Overall AROM Comments pt with limited participation and ability to perform ROM due to c/o pain.  Pt did not want to perform movements due to concern for pain.   AROM Assessment Site Lumbar   Lumbar Flexion 22   Lumbar Extension 12   Strength   Overall Strength Comments Pt with limited tolerance to activities; when attempting MMT pt refusing to hold or resist due to pain.  Strength appears WFL due to no significant gait abnormalities and ability to amb 50' x 2 in clinic   Strength Assessment Site Hip;Knee   Flexibility   Soft Tissue Assessment /Muscle Length yes   Hamstrings appears tight bil but pt requesting to stop testing due to pain   Palpation   Palpation comment tenderness along lumbar spine with gentle pressure and pt requesting to stop assessment   Special Tests    Special Tests Lumbar;Sacrolliac Tests   Lumbar Tests Straight Leg Raise   Sacroiliac Tests  --   Straight Leg Raise   Findings Negative   Comment bil; pt requesting to stop   Pelvic Dictraction  Findings Negative   Comment sensitive to palpation   Pelvic Compression   Findings Negative   comment sensitive to palpation   Ambulation/Gait   Ambulation Distance (Feet) 100 Feet  50'x2   Gait Pattern Within Functional Limits   Gait velocity decreased                   [facility] Adult PT Treatment/Exercise - 01/01/16 1339    Lumbar Exercises: Stretches   Passive Hamstring Stretch 1 rep;20 seconds   Passive Hamstring Stretch Limitations bil; seated in chair   Lumbar Exercises: Seated   Long Arc Quad on Chair Both;10 reps   Other Seated Lumbar Exercises hip flexion in chair x 10 bil                PT Education - 01/01/16 1507    Education provided Yes   Education Details HEP, insurance limitations    Person(s) Educated Patient   Methods Explanation;Demonstration;Handout   Comprehension Verbalized understanding;Returned demonstration                    Plan - 01/01/16 1508    Clinical Impression Statement Pt is a 45 y/o male who presents to OPPT with chronic hx of LBP.  Pt with limited tolerance to objective testing and limited tolerance to exercises.  At this time feel pt is unable to fully tolerate exercises appropriate for low back pain.  Provided seated hamstring stretch, LAQ and marching as this is all pt could tolerate today in clinic.  Pt's insurance only covers initial evaluation and pt unable to proceed as self pay therefore one time visit.  Called intrepreter to follow up as intrepreter didn't present for evaluation.  Company stated they did not have coverage for visit.  Began to call language line and pt repeatedly saying "Judeth CornfieldStephanie, no."  Hung up phone per pt's request and pt called friend on his cell phone to provide interpretation.  Offered to use language line again via pt's friend interpreter and both declined this service.     Pt will benefit from skilled therapeutic intervention in order to improve on the following deficits Pain;Decreased strength;Decreased range of motion   PT Frequency One time visit   PT Next Visit Plan 1x eval   Consulted and Agree with Plan of Care Patient         Problem List Patient Active Problem List   Diagnosis Date Noted  . Lumbar disc disease with radiculopathy 09/20/2015  . Refugee health examination 09/20/2015  . Erectile dysfunction 09/20/2015  . Hematemesis with nausea 08/31/2015   Clarita CraneStephanie F Arrion Broaddus, PT, DPT 01/01/2016 4:21 PM  [facility] [address] [place], [place], [id number] Phone: [phone]   Fax:  [phone]  Name: Lawrence Werner MRN: [id number] Date of Birth: 10/03/1971

## 2016-01-01 NOTE — Patient Instructions (Signed)
Hamstring Stretch (Sitting)    Sitting, extend one leg and place hands on same thigh for support. Keeping torso straight, lean forward, sliding hands down leg, until a stretch is felt in back of thigh. Hold _20-30___ seconds. Repeat with other leg.  Copyright  VHI. All rights reserved.    Hip (Front)    Begin sitting tall, both feet flat on floor. Inhale, then exhale while lifting knee as high as is comfortable, keeping upper body straight and still. Slowly return to starting position. Repeat __10__ times each leg. Do __1__ sets per session. Do __2-3__ sessions per day.   Copyright  VHI. All rights reserved.   Long [employer] Quad    Straighten operated leg and try to hold it __2__ seconds.  Repeat __10__ times. Do __1-2__ sessions a day.  [email or web address].exer.us/310   Copyright  VHI. All rights reserved.

## 2016-01-16 ENCOUNTER — Ambulatory Visit (INDEPENDENT_AMBULATORY_CARE_PROVIDER_SITE_OTHER): Payer: Medicaid Other | Admitting: Student

## 2016-01-16 ENCOUNTER — Encounter: Payer: Self-pay | Admitting: Student

## 2016-01-16 VITALS — BP 116/76 | HR 78 | Temp 98.5°F | Wt 210.0 lb

## 2016-01-16 DIAGNOSIS — M5116 Intervertebral disc disorders with radiculopathy, lumbar region: Secondary | ICD-10-CM

## 2016-01-16 NOTE — Patient Instructions (Signed)
Make appointment with Orthopedics. Please ask your Friends to call orthopedic surgery to make an appointment If you have worsening pain or weakness please call the office to be seen If you have questions or concerns, call the office at [phone]

## 2016-01-19 NOTE — Progress Notes (Signed)
   Subjective:    Patient ID: Lawrence Werner, male    DOB: 1971-08-31, 45 y.o.   MRN: [id number]  Arabic interpreter used for the entirety of the visit   CC: lower back pain  HPI: 45 y/o with chromic lower back pain with radiculopathy presents for continued back pain  Back pain - Pt reports continued back pain that has been present since 02/2015 - feels it is getting progressively worse - reports sharp shooting pain down his bilateral legs and occasionally to his feet - he denies weakness, loss of bowel or bladder function - he has been evaluated by neurosurgery but his insurance declined coverage for the medication prescribed by them, he has been unable to recall the name of this medication - he has since been referred to orthopedic surgery, but he did not answer the phone when he was called by them  Review of Systems ROS Per HPI else he denies headache, fever, recent illness, chest pain, SOB  Past Medical, Surgical, Social, and Family History Reviewed & Updated per EMR.   Objective:  BP 116/76 mmHg  Pulse 78  Temp(Src) 98.5 F (36.9 C) (Oral)  Wt 210 lb (95.255 kg) Vitals and nursing note reviewed  General: NAD Cardiac: RRR,  Respiratory: CTAB, normal effort MSK: tender to palpation difusely over lower back, 5/5 LE strength to hip flex, knee extension, ankle dorsiflexion and plantar flexion bilaterally, + straight leg raise bilaterally Skin: warm and dry, no rashes noted Neuro: alert and oriented, no focal deficits   Assessment & Plan:    Lumbar disc disease with radiculopathy Continued back pain with planned evaluation by orthopedics, but he missed their call for an appointment.  - Will refer again -as there is concern for significant language barrier, he has provided a friend's phone number to help him make appointments - this person will call to make the appointment   - will continue to follow     Alyssa A. Kennon Rounds MD, MS Family Medicine Resident  PGY-2 Pager [phone]

## 2016-01-19 NOTE — Assessment & Plan Note (Addendum)
Continued back pain with planned evaluation by orthopedics, but he missed their call for an appointment.  - Will refer again -as there is concern for significant language barrier, he has provided a friend's phone number to help him make appointments - this person will call to make the appointment   - will continue to follow

## 2016-01-20 ENCOUNTER — Telehealth: Payer: Self-pay | Admitting: Family Medicine

## 2016-01-20 NOTE — Telephone Encounter (Signed)
Pt doesn't speak English Darlis Loan is his case worker and stated another referral was needed for [facility] Please call Faiza  [phone]

## 2016-01-23 ENCOUNTER — Ambulatory Visit (INDEPENDENT_AMBULATORY_CARE_PROVIDER_SITE_OTHER): Payer: Medicaid Other | Admitting: Family Medicine

## 2016-01-23 VITALS — BP 126/77 | HR 87 | Temp 98.4°F | Wt 205.1 lb

## 2016-01-23 DIAGNOSIS — M5116 Intervertebral disc disorders with radiculopathy, lumbar region: Secondary | ICD-10-CM

## 2016-01-23 MED ORDER — GABAPENTIN 300 MG PO CAPS: 300.0000 mg | ORAL_CAPSULE | Freq: Three times a day (TID) | ORAL | Status: AC

## 2016-01-23 NOTE — Patient Instructions (Addendum)
Thank you for coming to see me today. It was a pleasure. Today we talked about:   Back pain: I will prescribe you Gabapentin to help with your pain. Please follow-up with your orthopedic surgeon for further management of your back.  If you have any questions or concerns, please do not hesitate to call the office at [phone].  Sincerely,  Jacquelin Hawking, MD  Gabapentin capsules or tablets What is this medicine? GABAPENTIN (GA ba pen tin) is used to control partial seizures in adults with epilepsy. It is also used to treat certain types of nerve pain. This medicine may be used for other purposes; ask your health care provider or pharmacist if you have questions. What should I tell my health care provider before I take this medicine? They need to know if you have any of these conditions: -kidney disease -suicidal thoughts, plans, or attempt; a previous suicide attempt by you or a family member -an unusual or allergic reaction to gabapentin, other medicines, foods, dyes, or preservatives -pregnant or trying to get pregnant -breast-feeding How should I use this medicine? Take this medicine by mouth with a glass of water. Follow the directions on the prescription label. You can take it with or without food. If it upsets your stomach, take it with food.Take your medicine at regular intervals. Do not take it more often than directed. Do not stop taking except on your doctor's advice. If you are directed to break the 600 or 800 mg tablets in half as part of your dose, the extra half tablet should be used for the next dose. If you have not used the extra half tablet within 28 days, it should be thrown away. A special MedGuide will be given to you by the pharmacist with each prescription and refill. Be sure to read this information carefully each time. Talk to your pediatrician regarding the use of this medicine in children. Special care may be needed. Overdosage: If you think you have taken too  much of this medicine contact a poison control center or emergency room at once. NOTE: This medicine is only for you. Do not share this medicine with others. What if I miss a dose? If you miss a dose, take it as soon as you can. If it is almost time for your next dose, take only that dose. Do not take double or extra doses. What may interact with this medicine? Do not take this medicine with any of the following medications: -other gabapentin products This medicine may also interact with the following medications: -alcohol -antacids -antihistamines for allergy, cough and cold -certain medicines for anxiety or sleep -certain medicines for depression or psychotic disturbances -homatropine; hydrocodone -naproxen -narcotic medicines (opiates) for pain -phenothiazines like chlorpromazine, mesoridazine, prochlorperazine, thioridazine This list may not describe all possible interactions. Give your health care provider a list of all the medicines, herbs, non-prescription drugs, or dietary supplements you use. Also tell them if you smoke, drink alcohol, or use illegal drugs. Some items may interact with your medicine. What should I watch for while using this medicine? Visit your doctor or health care professional for regular checks on your progress. You may want to keep a record at home of how you feel your condition is responding to treatment. You may want to share this information with your doctor or health care professional at each visit. You should contact your doctor or health care professional if your seizures get worse or if you have any new types of seizures. Do  not stop taking this medicine or any of your seizure medicines unless instructed by your doctor or health care professional. Stopping your medicine suddenly can increase your seizures or their severity. Wear a medical identification bracelet or chain if you are taking this medicine for seizures, and carry a card that lists all your  medications. You may get drowsy, dizzy, or have blurred vision. Do not drive, use machinery, or do anything that needs mental alertness until you know how this medicine affects you. To reduce dizzy or fainting spells, do not sit or stand up quickly, especially if you are an older patient. Alcohol can increase drowsiness and dizziness. Avoid alcoholic drinks. Your mouth may get dry. Chewing sugarless gum or sucking hard candy, and drinking plenty of water will help. The use of this medicine may increase the chance of suicidal thoughts or actions. Pay special attention to how you are responding while on this medicine. Any worsening of mood, or thoughts of suicide or dying should be reported to your health care professional right away. Women who become pregnant while using this medicine may enroll in the [place] American Antiepileptic Drug Pregnancy Registry by calling [phone]. This registry collects information about the safety of antiepileptic drug use during pregnancy. What side effects may I notice from receiving this medicine? Side effects that you should report to your doctor or health care professional as soon as possible: -allergic reactions like skin rash, itching or hives, swelling of the face, lips, or tongue -worsening of mood, thoughts or actions of suicide or dying Side effects that usually do not require medical attention (report to your doctor or health care professional if they continue or are bothersome): -constipation -difficulty walking or controlling muscle movements -dizziness -nausea -slurred speech -tiredness -tremors -weight gain This list may not describe all possible side effects. Call your doctor for medical advice about side effects. You may report side effects to FDA at 1-800-FDA-1088. Where should I keep my medicine? Keep out of reach of children. This medicine may cause accidental overdose and death if it taken by other adults, children, or pets. Mix any unused  medicine with a substance like cat litter or coffee grounds. Then throw the medicine away in a sealed container like a sealed bag or a coffee can with a lid. Do not use the medicine after the expiration date. Store at room temperature between 15 and 30 degrees C (59 and 86 degrees F). NOTE: This sheet is a summary. It may not cover all possible information. If you have questions about this medicine, talk to your doctor, pharmacist, or health care provider.    2016, Elsevier/Gold Standard. (2014-02-09 15:26:50)

## 2016-01-23 NOTE — Assessment & Plan Note (Signed)
Will prescribe gabapentin for patient. Follow-up with orthopedic surgeon. Letter written discussing patient's functional limitations.

## 2016-01-23 NOTE — Progress Notes (Signed)
    Subjective   Lawrence Werner is a 45 y.o. male that presents for a same day visit  [employer] [phone]; [phone]  1. Back pain: This is a chronic issue. Patient has a history of lumbar disc disease with bilateral radiculopathy. He reports significant pain in his back that has not improved with radiation down both legs with tingling. He has an appointment with orthopedic surgery for follow-up of his back pain. No bladder or bowel dysfunction.   ROS Per HPI  Social History  Substance Use Topics  . Smoking status: Never Smoker   . Smokeless tobacco: Not on file  . Alcohol Use: No    No Known Allergies  Objective   BP 126/77 mmHg  Pulse 87  Temp(Src) 98.4 F (36.9 C) (Oral)  Wt 205 lb 1.6 oz (93.033 kg)  General: Well appearing, no distress  Assessment and Plan   Meds ordered this encounter  Medications  . gabapentin (NEURONTIN) 300 MG capsule    Sig: Take 1 capsule (300 mg total) by mouth 3 (three) times daily. Take one pill today, two tomorrow then take three times daily    Dispense:  90 capsule    Refill:  3   Lumbar disc disease with radiculopathy Will prescribe gabapentin for patient. Follow-up with orthopedic surgeon. Letter written discussing patient's functional limitations.

## 2016-01-24 NOTE — Telephone Encounter (Signed)
Please see follow up appts for this concern. Jazmin Hartsell,CMA

## 2016-02-27 ENCOUNTER — Ambulatory Visit (INDEPENDENT_AMBULATORY_CARE_PROVIDER_SITE_OTHER): Payer: Medicaid Other | Admitting: Family Medicine

## 2016-02-27 ENCOUNTER — Encounter: Payer: Self-pay | Admitting: Family Medicine

## 2016-02-27 VITALS — BP 108/77 | HR 91 | Temp 98.4°F | Wt 209.5 lb

## 2016-02-27 DIAGNOSIS — M792 Neuralgia and neuritis, unspecified: Secondary | ICD-10-CM | POA: Diagnosis not present

## 2016-02-27 DIAGNOSIS — M791 Myalgia, unspecified site: Secondary | ICD-10-CM

## 2016-02-27 LAB — CK: CK TOTAL: 75 U/L (ref 7–232)

## 2016-02-27 LAB — C-REACTIVE PROTEIN: CRP: 0.6 mg/dL — AB (ref <0.60)

## 2016-02-27 MED ORDER — DULOXETINE HCL 60 MG PO CPEP: 60.0000 mg | ORAL_CAPSULE | Freq: Every day | ORAL | Status: DC

## 2016-02-27 NOTE — Patient Instructions (Signed)
Thank you for coming to see me today. It was a pleasure. Today we talked about:   Pain: I will start you on Cymbalta. I will also get some labs. We will try to control your pain and make sure something else isn't causing your pain. At this point, I don't think neurosurgery will be helpful.  Please make an appointment to see me in one month for follow-up of your back pain.  If you have any questions or concerns, please do not hesitate to call the office at [phone].  Sincerely,  Jacquelin Hawking, MD

## 2016-02-27 NOTE — Progress Notes (Signed)
    Subjective    Lawrence Werner is a 45 y.o. male that presents for a follow-up visit for:   1. Back pain: Chronic issue. Pain has been getting worse. He has pain along his lower back radiating down his legs. He also describes pain in his arms. He has been seen at by Dr. Juliene Pina for evaluation who recommended follow-up at neurosurgery. He is able to do most of his ADLs, although with significant pain. Symptoms are improved when lying on his stomach and when taking a hot shower. Gabapentin did not help with symptoms. No falls. He reports some weakness in his thighs and arms.  Social History  Substance Use Topics  . Smoking status: Never Smoker   . Smokeless tobacco: None  . Alcohol Use: No    No Known Allergies  No orders of the defined types were placed in this encounter.    ROS  Per HPI   Objective   BP 108/77 mmHg  Pulse 91  Temp(Src) 98.4 F (36.9 C) (Oral)  Wt 209 lb 8 oz (95.029 kg)  Vital signs reviewed  General: Well appearing, no significant distress Musculoskeletal: Significant tenderness with light palpation over thoracic and lumbar spine and left paraspinal area. Tenderness with light tough of bilateral arms and thighs. Decreased sensation in radial nerve distribution of bilateral arms. Patient no cooperative with strength training and most of exam stating that it was hurting him. Neuro: Would not cooperate for reflex testing  Assessment and Plan    1. Neuropathic pain Symptoms very confusing. Constellation of symptoms do not correlate well with a specific neurological etiology that I can discern. Differential includes MS, spinal cord lesion, somatization, malingering. Possibly fibromyalgia is in differential as well. Will treat conservatively as he has been evaluated by neurosurgery and orthopedic surgery. May need neurology evaluation eventually. - DULoxetine (CYMBALTA) 60 MG capsule; Take 1 capsule (60 mg total) by mouth daily.  Dispense: 30 capsule;  Refill: 2  2. Muscle pain - POCT SEDIMENTATION RATE - CK - C-reactive protein

## 2016-02-28 NOTE — Addendum Note (Signed)
Addended by: Jennette BillBUSICK, [place] on: 02/28/2016 03:02 PM   Modules accepted: Orders

## 2016-04-06 ENCOUNTER — Ambulatory Visit: Payer: Medicaid Other | Admitting: Family Medicine

## 2016-05-20 ENCOUNTER — Emergency Department (HOSPITAL_COMMUNITY)
Admission: EM | Admit: 2016-05-20 | Discharge: 2016-05-20 | Disposition: A | Discharge status: Home / Self Care | Payer: Medicaid Other | Attending: Emergency Medicine | Admitting: Emergency Medicine

## 2016-05-20 ENCOUNTER — Encounter (HOSPITAL_COMMUNITY): Payer: Self-pay | Admitting: Family Medicine

## 2016-05-20 ENCOUNTER — Ambulatory Visit (HOSPITAL_COMMUNITY)
Admission: EM | Admit: 2016-05-20 | Discharge: 2016-05-20 | Disposition: A | Discharge status: Home / Self Care | Payer: Medicaid Other | Attending: Family Medicine | Admitting: Family Medicine

## 2016-05-20 ENCOUNTER — Encounter (HOSPITAL_COMMUNITY): Payer: Self-pay | Admitting: *Deleted

## 2016-05-20 DIAGNOSIS — L989 Disorder of the skin and subcutaneous tissue, unspecified: Secondary | ICD-10-CM | POA: Diagnosis not present

## 2016-05-20 DIAGNOSIS — A09 Infectious gastroenteritis and colitis, unspecified: Secondary | ICD-10-CM

## 2016-05-20 DIAGNOSIS — R197 Diarrhea, unspecified: Secondary | ICD-10-CM | POA: Diagnosis not present

## 2016-05-20 DIAGNOSIS — Z79899 Other long term (current) drug therapy: Secondary | ICD-10-CM | POA: Diagnosis not present

## 2016-05-20 DIAGNOSIS — R1110 Vomiting, unspecified: Secondary | ICD-10-CM | POA: Diagnosis present

## 2016-05-20 DIAGNOSIS — K921 Melena: Secondary | ICD-10-CM | POA: Diagnosis not present

## 2016-05-20 DIAGNOSIS — R238 Other skin changes: Secondary | ICD-10-CM

## 2016-05-20 LAB — TYPE AND SCREEN
ABO/RH(D): O POS
Antibody Screen: NEGATIVE

## 2016-05-20 LAB — CBC
HEMATOCRIT: 34.2 % — AB (ref 39.0–52.0)
HEMOGLOBIN: 10.9 g/dL — AB (ref 13.0–17.0)
MCH: 26 pg (ref 26.0–34.0)
MCHC: 31.9 g/dL (ref 30.0–36.0)
MCV: 81.6 fL (ref 78.0–100.0)
Platelets: 264 10*3/uL (ref 150–400)
RBC: 4.19 MIL/uL — ABNORMAL LOW (ref 4.22–5.81)
RDW: 13.4 % (ref 11.5–15.5)
WBC: 9.7 10*3/uL (ref 4.0–10.5)

## 2016-05-20 LAB — URINALYSIS, ROUTINE W REFLEX MICROSCOPIC
Bilirubin Urine: NEGATIVE
Glucose, UA: NEGATIVE mg/dL
Ketones, ur: NEGATIVE mg/dL
LEUKOCYTES UA: NEGATIVE
Nitrite: NEGATIVE
PROTEIN: NEGATIVE mg/dL
SPECIFIC GRAVITY, URINE: 1.031 — AB (ref 1.005–1.030)
pH: 5.5 (ref 5.0–8.0)

## 2016-05-20 LAB — COMPREHENSIVE METABOLIC PANEL
ALBUMIN: 3.7 g/dL (ref 3.5–5.0)
ALK PHOS: 65 U/L (ref 38–126)
ALT: 22 U/L (ref 17–63)
ANION GAP: 5 (ref 5–15)
AST: 15 U/L (ref 15–41)
BILIRUBIN TOTAL: 0.4 mg/dL (ref 0.3–1.2)
BUN: 13 mg/dL (ref 6–20)
CALCIUM: 9.5 mg/dL (ref 8.9–10.3)
CO2: 26 mmol/L (ref 22–32)
Chloride: 106 mmol/L (ref 101–111)
Creatinine, Ser: 0.91 mg/dL (ref 0.61–1.24)
GFR calc Af Amer: >60 mL/min (ref >60)
GFR calc non Af Amer: >60 mL/min (ref >60)
GLUCOSE: 225 mg/dL — AB (ref 65–99)
Potassium: 3.7 mmol/L (ref 3.5–5.1)
SODIUM: 137 mmol/L (ref 135–145)
TOTAL PROTEIN: 6.5 g/dL (ref 6.5–8.1)

## 2016-05-20 LAB — URINE MICROSCOPIC-ADD ON: WBC, UA: NONE SEEN WBC/hpf (ref 0–5)

## 2016-05-20 LAB — POC OCCULT BLOOD, ED: Fecal Occult Bld: POSITIVE — AB

## 2016-05-20 LAB — ABO/RH: ABO/RH(D): O POS

## 2016-05-20 MED ORDER — LACTINEX PO CHEW: 1.0000 | CHEWABLE_TABLET | Freq: Three times a day (TID) | ORAL | Status: AC

## 2016-05-20 MED ORDER — LORAZEPAM 1 MG PO TABS
0.5000 mg | ORAL_TABLET | Freq: Once | ORAL | Status: AC
  Administered 2016-05-20: 0.5 mg via ORAL
  Filled 2016-05-20: qty 1

## 2016-05-20 NOTE — Discharge Instructions (Signed)
You will need to see your doctor or go to the emergency department for further evaluation.  You may need medication to help you relax so that a rectal exam can be done.   Do no eat prior to going to the emergency department.   Your doctor can follow up your skin irritation.

## 2016-05-20 NOTE — ED Notes (Signed)
Pt   Was     Advised  By  Barbra SarksFrank  Patrick  To  Proceed  To  The  Er  Pt   Refused     Stated  He  Had  To      Pick  Up     His son

## 2016-05-20 NOTE — Discharge Instructions (Signed)
Stay very hydrated. Take the probiotic as directed. Follow up with your primary physician in 7 days. Return to ER for new or worsening symptoms, any additional concerns.

## 2016-05-20 NOTE — ED Provider Notes (Signed)
CSN: [id number]     Arrival date & time 05/20/16  1558 History   First MD Initiated Contact with Patient 05/20/16 2017     Chief Complaint  Patient presents with  . Diarrhea  . Emesis    (Consider location/radiation/quality/duration/timing/severity/associated sxs/prior Treatment) Patient is a 45 y.o. male presenting with diarrhea and vomiting. The history is provided by the patient and medical records. A language interpreter was used Hospital doctor([employer]- Arabic).  Diarrhea Associated symptoms: vomiting   Associated symptoms: no abdominal pain, no chills, no fever and no headaches   Emesis Associated symptoms: diarrhea   Associated symptoms: no abdominal pain, no chills and no headaches    Alvey Bertram Denverajeeb Kwasnik is a 45 y.o. male  with no known PMH who presents to the Emergency Department complaining of n/v which began yesterday. Patient states he had two episodes of emesis with small amount of blood. 10-12 episodes of diarrhea - patient endorses dark blood in stool. He states symptoms began [facility]. Family also ate this meat, but are not experiencing the same symptoms. Denies fever (99.77F in triage). Denies abdominal pain, dysuria. No medications taken PTA.   History reviewed. No pertinent past medical history. History reviewed. No pertinent past surgical history. History reviewed. No pertinent family history. Social History  Substance Use Topics  . Smoking status: Never Smoker   . Smokeless tobacco: None  . Alcohol Use: No    Review of Systems  Constitutional: Negative for fever and chills.  HENT: Negative for congestion.   Eyes: Negative for visual disturbance.  Respiratory: Negative for cough and shortness of breath.   Cardiovascular: Negative.   Gastrointestinal: Positive for vomiting, diarrhea and blood in stool. Negative for nausea and abdominal pain.  Genitourinary: Negative for dysuria.  Musculoskeletal: Negative for back pain and  neck pain.  Skin: Negative for rash.  Neurological: Negative for dizziness and headaches.      Allergies  Review of patient's allergies indicates no known allergies.  Home Medications   Prior to Admission medications   Medication Sig Start Date End Date Taking? Authorizing Provider  DULoxetine (CYMBALTA) 60 MG capsule Take 1 capsule (60 mg total) by mouth daily. 02/27/16   Narda Bondsalph A Nettey, MD  gabapentin (NEURONTIN) 300 MG capsule Take 1 capsule (300 mg total) by mouth 3 (three) times daily. Take one pill today, two tomorrow then take three times daily 01/23/16   Narda Bondsalph A Nettey, MD  lactobacillus acidophilus & bulgar (LACTINEX) chewable tablet Chew 1 tablet by mouth 3 (three) times daily with meals. 05/20/16   Jaime Pilcher Ward, PA-C  omeprazole (PRILOSEC) 40 MG capsule Take 1 capsule (40 mg total) by mouth daily. 08/26/15   Narda Bondsalph A Nettey, MD   BP 130/78 mmHg  Pulse 78  Temp(Src) 99.4 F (37.4 C) (Oral)  Resp 18  SpO2 97% Physical Exam  Constitutional: He is oriented to person, place, and time. He appears well-developed and well-nourished.  Alert and in no acute distress  HENT:  Head: Normocephalic and atraumatic.  Cardiovascular: Normal rate, regular rhythm, normal heart sounds and intact distal pulses.  Exam reveals no gallop and no friction rub.   No murmur heard. Pulmonary/Chest: Effort normal and breath sounds normal. No respiratory distress. He has no wheezes. He has no rales. He exhibits no tenderness.  Abdominal: Bowel sounds are normal. He exhibits no mass. There is no rebound and no guarding.  Soft, NT, ND.   Genitourinary: Rectal exam shows tenderness.  Neurological:  He is alert and oriented to person, place, and time.  Skin: Skin is warm and dry.  Nursing note and vitals reviewed.   ED Course  Procedures (including critical care time) Labs Review Labs Reviewed  COMPREHENSIVE METABOLIC PANEL - Abnormal; Notable for the following:    Glucose, Bld 225 (*)    All  other components within normal limits  CBC - Abnormal; Notable for the following:    RBC 4.19 (*)    Hemoglobin 10.9 (*)    HCT 34.2 (*)    All other components within normal limits  URINALYSIS, ROUTINE W REFLEX MICROSCOPIC (NOT AT [facility]) - Abnormal; Notable for the following:    Specific Gravity, Urine 1.031 (*)    Hgb urine dipstick TRACE (*)    All other components within normal limits  URINE MICROSCOPIC-ADD ON - Abnormal; Notable for the following:    Squamous Epithelial / LPF 0-5 (*)    Bacteria, UA RARE (*)    All other components within normal limits  POC OCCULT BLOOD, ED - Abnormal; Notable for the following:    Fecal Occult Bld POSITIVE (*)    All other components within normal limits  TYPE AND SCREEN  ABO/RH    Imaging Review No results found. I have personally reviewed and evaluated these images and lab results as part of my medical decision-making.   EKG Interpretation None      MDM   Final diagnoses:  Vomiting and diarrhea   Blayn Najeeb Bergsma presents to ED for n/v/d since yesterday. Admits to blood in the stool. 1-2 episodes of emesis with small amount of blood. No abdominal pain or tenderness on exam.   H&H of 10.9/34.2, CMP with glucose of 225. Hemoccult positive.   A&P: Elevated glucose - discussed dietary changes and follow up with PCP.  Vomiting and diarrhea - Start probiotic, discussed dietary changes, PCP follow up.  Evaluation does not show pathology that would require ongoing emergent intervention or inpatient treatment. Patient is hemodynamically stable and mentating appropriately. Discussed findings and plan with patient, who agrees with treatment plan as dictated. PCP follow up strongly encourage. Return precautions discussed and all questions answered.   Patient discussed with Dr. Clarene Duke who agrees with treatment plan.     [facility] Ward, PA-C 05/20/16 2256  Laurence Spates, MD 05/21/16 [phone]

## 2016-05-20 NOTE — ED Notes (Signed)
[employer]    Utilized      Diarrhea        And   Vomiting        With  Black  Spots     As   Well      With  Symptoms  Starting  yest          Pt  Vomited  X  1       With  5-6  X  Diarrhea        Over  24 hours        no  Other  Family  Members  Involved        Has  Been  Eating  Rice  Potatoes  And  Yogurt        Vomited       Potatoes         Pt  Also has   A  Rash       In perineal  Area     Pt  Reports   Black  Spots  With  The  vomititing  And  The  Diarrhea   Pt is  Awake  And  Alert       Oriented

## 2016-05-20 NOTE — ED Notes (Signed)
Patient provided urinal and asked to provide sample.

## 2016-05-20 NOTE — ED Notes (Signed)
Pt here for abd pain, nausea, vomiting. Sts some bloody vomit. [facility]

## 2016-05-20 NOTE — ED Notes (Signed)
Barbra SarksFrank  Patrick  Attempted  Rectal  Exam  Pt  Refused

## 2016-05-21 NOTE — ED Provider Notes (Signed)
CSN: [id number]     Arrival date & time 05/20/16  1257 History   First MD Initiated Contact with Patient 05/20/16 1319     Chief Complaint  Patient presents with  . Diarrhea   (Consider location/radiation/quality/duration/timing/severity/associated sxs/prior Treatment) HPI History obtained from patient with telephone Arabic Interpreter.  Pt presents with the cc of:  Diarrhea and vomiting  Duration of symptoms: several days Treatment prior to arrival: OTC meds  Context: sudden onset of diarrhea and vomiting.  Other symptoms include: blood in vomit and diarrhea Pain score: 3 FAMILY HISTORY: not explored    History reviewed. No pertinent past medical history. History reviewed. No pertinent past surgical history. History reviewed. No pertinent family history. Social History  Substance Use Topics  . Smoking status: Never Smoker   . Smokeless tobacco: None  . Alcohol Use: No    Review of Systems  Denies: HEADACHE, NAUSEA,  CHEST PAIN, CONGESTION, DYSURIA, SHORTNESS OF BREATH  Allergies  Review of patient's allergies indicates no known allergies.  Home Medications   Prior to Admission medications   Medication Sig Start Date End Date Taking? Authorizing Provider  DULoxetine (CYMBALTA) 60 MG capsule Take 1 capsule (60 mg total) by mouth daily. 02/27/16   Narda Bondsalph A Nettey, MD  gabapentin (NEURONTIN) 300 MG capsule Take 1 capsule (300 mg total) by mouth 3 (three) times daily. Take one pill today, two tomorrow then take three times daily 01/23/16   Narda Bondsalph A Nettey, MD  lactobacillus acidophilus & bulgar (LACTINEX) chewable tablet Chew 1 tablet by mouth 3 (three) times daily with meals. 05/20/16   Jaime Pilcher Ward, PA-C  omeprazole (PRILOSEC) 40 MG capsule Take 1 capsule (40 mg total) by mouth daily. 08/26/15   Narda Bondsalph A Nettey, MD   Meds Ordered and Administered this Visit  Medications - No data to display  BP 114/80 mmHg  Pulse 80  Temp(Src) 98.6 F (37 C) (Oral)  Resp 14  SpO2  100% No data found.   Physical Exam NURSES NOTES AND VITAL SIGNS REVIEWED. CONSTITUTIONAL: Well developed, well nourished, no acute distress HEENT: normocephalic, atraumatic EYES: Conjunctiva normal NECK:normal ROM, supple, no adenopathy PULMONARY:No respiratory distress, normal effort ABDOMINAL: Soft, ND, NT BS+, No CVAT MUSCULOSKELETAL: Normal ROM of all extremities,  SKIN: warm and dry without rash PSYCHIATRIC: Mood and affect, behavior are normal  ED Course  Procedures (including critical care time)  Labs Review Labs Reviewed - No data to display  Imaging Review No results found.   Visual Acuity Review  Right Eye Distance:   Left Eye Distance:   Bilateral Distance:    Right Eye Near:   Left Eye Near:    Bilateral Near:         MDM   1. Skin irritation   2. Bloody diarrhea    Advised to attend ER as he does not want rectal exam performed. Unable give reassurance of no serious pathology.     Tharon AquasFrank C Vada Yellen, PA 05/21/16 1024

## 2016-05-27 ENCOUNTER — Ambulatory Visit (INDEPENDENT_AMBULATORY_CARE_PROVIDER_SITE_OTHER): Payer: Medicaid Other | Admitting: Internal Medicine

## 2016-05-27 ENCOUNTER — Encounter: Payer: Self-pay | Admitting: Internal Medicine

## 2016-05-27 VITALS — BP 121/78 | HR 68 | Temp 98.6°F | Ht 68.5 in | Wt 205.2 lb

## 2016-05-27 DIAGNOSIS — R7309 Other abnormal glucose: Secondary | ICD-10-CM | POA: Diagnosis present

## 2016-05-27 DIAGNOSIS — R7303 Prediabetes: Secondary | ICD-10-CM

## 2016-05-27 DIAGNOSIS — B372 Candidiasis of skin and nail: Secondary | ICD-10-CM | POA: Diagnosis not present

## 2016-05-27 DIAGNOSIS — R739 Hyperglycemia, unspecified: Secondary | ICD-10-CM

## 2016-05-27 LAB — GLUCOSE, CAPILLARY: Glucose-Capillary: 101 mg/dL — ABNORMAL HIGH (ref 65–99)

## 2016-05-27 LAB — POCT GLYCOSYLATED HEMOGLOBIN (HGB A1C): Hemoglobin A1C: 6.2

## 2016-05-27 MED ORDER — NYSTATIN 100000 UNIT/GM EX CREA: 1.0000 "application " | TOPICAL_CREAM | Freq: Two times a day (BID) | CUTANEOUS | Status: DC

## 2016-05-27 NOTE — Patient Instructions (Signed)
Prediabetes Eating Plan Prediabetes--also called impaired glucose tolerance or impaired fasting glucose--is a condition that causes blood sugar (blood glucose) levels to be higher than normal. Following a healthy diet can help to keep prediabetes under control. It can also help to lower the risk of type 2 diabetes and heart disease, which are increased in people who have prediabetes. Along with regular exercise, a healthy diet:  Promotes weight loss.  Helps to control blood sugar levels.  Helps to improve the way that the body uses insulin. WHAT DO I NEED TO KNOW ABOUT THIS EATING PLAN?  Use the glycemic index (GI) to plan your meals. The index tells you how quickly a food will raise your blood sugar. Choose low-GI foods. These foods take a longer time to raise blood sugar.  Pay close attention to the amount of carbohydrates in the food that you eat. Carbohydrates increase blood sugar levels.  Keep track of how many calories you take in. Eating the right amount of calories will help you to achieve a healthy weight. Losing about 7 percent of your starting weight can help to prevent type 2 diabetes.  You may want to follow a Mediterranean diet. This diet includes a lot of vegetables, lean meats or fish, whole grains, fruits, and healthy oils and fats. WHAT FOODS CAN I EAT? Grains Whole grains, such as whole-wheat or whole-grain breads, crackers, cereals, and pasta. Unsweetened oatmeal. Bulgur. Barley. Quinoa. Brown rice. Corn or whole-wheat flour tortillas or taco shells. Vegetables Lettuce. Spinach. Peas. Beets. Cauliflower. Cabbage. Broccoli. Carrots. Tomatoes. Squash. Eggplant. Herbs. Peppers. Onions. Cucumbers. Brussels sprouts. Fruits Berries. Bananas. Apples. Oranges. Grapes. Papaya. Mango. Pomegranate. Kiwi. Grapefruit. Cherries. Meats and Other Protein Sources Seafood. Lean meats, such as chicken and turkey or lean cuts of pork and beef. Tofu. Eggs. Nuts. Beans. Dairy Low-fat or  fat-free dairy products, such as yogurt, cottage cheese, and cheese. Beverages Water. Tea. Coffee. Sugar-free or diet soda. Seltzer water. Milk. Milk alternatives, such as soy or almond milk. Condiments Mustard. Relish. Low-fat, low-sugar ketchup. Low-fat, low-sugar barbecue sauce. Low-fat or fat-free mayonnaise. Sweets and Desserts Sugar-free or low-fat pudding. Sugar-free or low-fat ice cream and other frozen treats. Fats and Oils Avocado. Walnuts. Olive oil. The items listed above may not be a complete list of recommended foods or beverages. Contact your dietitian for more options.  WHAT FOODS ARE NOT RECOMMENDED? Grains Refined white flour and flour products, such as bread, pasta, snack foods, and cereals. Beverages Sweetened drinks, such as sweet iced tea and soda. Sweets and Desserts Baked goods, such as cake, cupcakes, pastries, cookies, and cheesecake. The items listed above may not be a complete list of foods and beverages to avoid. Contact your dietitian for more information.   This information is not intended to replace advice given to you by your health care provider. Make sure you discuss any questions you have with your health care provider.   Document Released: 04/30/2015 Document Reviewed: 04/30/2015 Elsevier Interactive Patient Education 2016 Elsevier Inc.  

## 2016-05-27 NOTE — Progress Notes (Signed)
Patient ID: Lawrence Werner, male   DOB: 05/23/1971, 45 y.o.   MRN: [id number] Date of Visit: 05/27/2016   HPI:  Lawrence Werner is here today to discuss elevated blood glucose and rash. Interpreter was used for the visit (Arabic speaking patient)  Elevated Glucose:  - patient was seen in the ED on 5/24 for vomiting and diarrhea. At that time he was told that his glucose was elevated and to follow up with PCP (glucose 225) - he is currently observing Ramadan; denies polydipsia, polyuria - random CBG in clinic was 101. Due to RF (> 40yo with BMI of 30.7), A1c was obtained and was 6.2, diagnosing him with pre-diabetes - discussed lifestyle changes to manage pre-diabetes. Patient reports that since the start of rhamadan, patient has been swimming for 5 hours daily. And since finding out about elevated CBG in the ED, patient has been mindful about his diet. We discussed that when patients are acutely ill, their CBG may also increase transiently.  Rash:  - noted of redness/irritation in inguenal area "for months" with burning pain  - has been stable - has tried baby powder without improvement   ROS: See HPI.  PMFSH:  Newly diagnosed prediabetes Obesity (BMI >30)  PHYSICAL EXAM: BP 121/78 mmHg  Pulse 68  Temp(Src) 98.6 F (37 C) (Oral)  Ht 5' 8.5" (1.74 m)  Wt 205 lb 3.2 oz (93.078 kg)  BMI 30.74 kg/m2 GEN: NAD CV: RRR, no murmurs, rubs, or gallops PULM: CTAB, normal effort SKIN: Patient preferred male provider to examine inguinal rash due to his observation of Rhamadan. Dr. Leveda AnnaHensel examined patient and the following were his findings: mild erythema in inguinal folds bilaterally without satellite lesions consistent with cutaneous candida.  NEURO: Awake, alert, no focal deficits grossly, normal speech  ASSESSMENT/PLAN:  Candidiasis, cutaneous Mild in inguinal region. Given Nystatin cream to apply twice daily until resolved. Follow up as needed  Prediabetes Discussed lifestyle  changes (exercise and diet). Provided handout. Follow up ~ 3 months to re-check A1c and further discuss.    FOLLOW UP: Follow up with PCP in ~3 months for pre-dm or sooner if skin symptoms do not improve  Lawrence HolterKanishka G Tamra Koos, MD [facility]

## 2016-05-30 DIAGNOSIS — B372 Candidiasis of skin and nail: Secondary | ICD-10-CM

## 2016-05-30 DIAGNOSIS — R7303 Prediabetes: Secondary | ICD-10-CM | POA: Insufficient documentation

## 2016-05-30 HISTORY — DX: Candidiasis of skin and nail: B37.2

## 2016-05-30 NOTE — Assessment & Plan Note (Signed)
Discussed lifestyle changes (exercise and diet). Provided handout. Follow up ~ 3 months to re-check A1c and further discuss.

## 2016-05-30 NOTE — Assessment & Plan Note (Signed)
Mild in inguinal region. Given Nystatin cream to apply twice daily until resolved. Follow up as needed

## 2016-06-17 ENCOUNTER — Encounter: Payer: Self-pay | Admitting: Family Medicine

## 2016-06-17 ENCOUNTER — Other Ambulatory Visit (HOSPITAL_COMMUNITY)
Admission: RE | Admit: 2016-06-17 | Discharge: 2016-06-17 | Disposition: A | Discharge status: Home / Self Care | Payer: Medicaid Other | Source: Ambulatory Visit | Attending: Family Medicine | Admitting: Family Medicine

## 2016-06-17 ENCOUNTER — Ambulatory Visit (INDEPENDENT_AMBULATORY_CARE_PROVIDER_SITE_OTHER): Payer: Medicaid Other | Admitting: Family Medicine

## 2016-06-17 VITALS — Temp 98.3°F | Wt 202.0 lb

## 2016-06-17 DIAGNOSIS — Z113 Encounter for screening for infections with a predominantly sexual mode of transmission: Secondary | ICD-10-CM | POA: Insufficient documentation

## 2016-06-17 DIAGNOSIS — R350 Frequency of micturition: Secondary | ICD-10-CM | POA: Diagnosis not present

## 2016-06-17 DIAGNOSIS — R399 Unspecified symptoms and signs involving the genitourinary system: Secondary | ICD-10-CM

## 2016-06-17 HISTORY — DX: Frequency of micturition: R35.0

## 2016-06-17 LAB — POCT URINALYSIS DIPSTICK
Bilirubin, UA: NEGATIVE
GLUCOSE UA: NEGATIVE
Ketones, UA: NEGATIVE
Leukocytes, UA: NEGATIVE
Nitrite, UA: NEGATIVE
PROTEIN UA: NEGATIVE
SPEC GRAV UA: 1.02
UROBILINOGEN UA: 0.2
pH, UA: 6.5

## 2016-06-17 LAB — POCT UA - MICROSCOPIC ONLY

## 2016-06-17 NOTE — Assessment & Plan Note (Signed)
Likely BPH given constellation of symptoms, though infection not ruled out. UA negative. Will send for culture. DRE deferred by patient today. Instructed patient to try to urinate regular intervals and avoid drinking large amounts of fluid before bed. Instructed to follow up if not improving in 2-3 weeks. If not improving, consider checking PSA and starting alpha antagonist and possibly finasteride.

## 2016-06-17 NOTE — Patient Instructions (Signed)
Your symptoms could be an infection, increased blood sugar, or your prostate.  We will do tests on your urine today.  Please try to urinate ar regular intervals. If you are still having symptoms in 1-2 weeks, let us know.  Take care,  Dr Jimmey RalphParker

## 2016-06-17 NOTE — Addendum Note (Signed)
Addended by: Ardith DarkPARKER, CALEB M on: 06/17/2016 09:54 AM   Modules accepted: Kipp BroodSmartSet

## 2016-06-17 NOTE — Progress Notes (Signed)
    Subjective:  Lawrence Werner is a 45 y.o. male who [facility] with a chief complaint of urinary frequency  HPI:  Urinary frequency. Symptoms started 2-3 weeks ago and include increased frequency, weak stream, hesitancy, and occasional dysuria. Is having to wake up several times at night to urinate. Has not tried any medications. No fevers or chills. No penile discharge. No nausea or vomiting.   ROS: Per HPI  PMH: Smoking history reviewed.   Objective:  Physical Exam: Temp(Src) 98.3 F (36.8 C) (Oral)  Wt 202 lb (91.627 kg)  Gen: NAD, resting comfortably CV: RRR with no murmurs appreciated Pulm: NWOB, CTAB with no crackles, wheezes, or rhonchi GI: Normal bowel sounds present. Soft, Nontender, Nondistended. GU: Deferred by patient.  MSK: no edema, cyanosis, or clubbing noted Skin: warm, dry Neuro: grossly normal, moves all extremities Psych: Normal affect and thought content  Results for orders placed or performed in visit on 06/17/16 (from the past 72 hour(s))  POCT urinalysis dipstick     Status: Abnormal   Collection Time: 06/17/16  9:01 AM  Result Value Ref Range   Color, UA YELLOW    Clarity, UA CLEAR    Glucose, UA NEG    Bilirubin, UA NEG    Ketones, UA NEG    Spec Grav, UA 1.020    Blood, UA SMALL    pH, UA 6.5    Protein, UA NEG    Urobilinogen, UA 0.2    Nitrite, UA NEG    Leukocytes, UA Negative Negative   Assessment/Plan:  Urinary frequency Likely BPH given constellation of symptoms, though infection not ruled out. UA negative. Will send for culture. DRE deferred by patient today. Instructed patient to try to urinate regular intervals and avoid drinking large amounts of fluid before bed. Instructed to follow up if not improving in 2-3 weeks. If not improving, consider checking PSA and starting alpha antagonist and possibly finasteride.   Katina Degreealeb M. Jimmey RalphParker, MD [facility] Resident PGY-2 06/17/2016 9:52 AM

## 2016-06-18 ENCOUNTER — Ambulatory Visit: Payer: Medicaid Other | Admitting: Family Medicine

## 2016-06-18 LAB — URINE CULTURE: Colony Count: 3000

## 2016-06-18 LAB — URINE CYTOLOGY ANCILLARY ONLY
CHLAMYDIA, DNA PROBE: NEGATIVE
Neisseria Gonorrhea: NEGATIVE

## 2016-06-22 ENCOUNTER — Encounter: Payer: Self-pay | Admitting: Family Medicine

## 2016-09-02 ENCOUNTER — Other Ambulatory Visit: Payer: Self-pay | Admitting: Internal Medicine

## 2016-09-02 DIAGNOSIS — B372 Candidiasis of skin and nail: Secondary | ICD-10-CM

## 2016-09-18 ENCOUNTER — Encounter: Payer: Self-pay | Admitting: Family Medicine

## 2016-09-18 ENCOUNTER — Ambulatory Visit (INDEPENDENT_AMBULATORY_CARE_PROVIDER_SITE_OTHER): Payer: Medicaid Other | Admitting: Family Medicine

## 2016-09-18 VITALS — BP 131/83 | HR 70 | Temp 98.3°F | Wt 202.0 lb

## 2016-09-18 DIAGNOSIS — R739 Hyperglycemia, unspecified: Secondary | ICD-10-CM | POA: Diagnosis not present

## 2016-09-18 DIAGNOSIS — J069 Acute upper respiratory infection, unspecified: Secondary | ICD-10-CM | POA: Diagnosis not present

## 2016-09-18 DIAGNOSIS — B9789 Other viral agents as the cause of diseases classified elsewhere: Principal | ICD-10-CM

## 2016-09-18 LAB — POCT GLYCOSYLATED HEMOGLOBIN (HGB A1C): HEMOGLOBIN A1C: 6.4

## 2016-09-18 MED ORDER — FLUTICASONE PROPIONATE 50 MCG/ACT NA SUSP: 2.0000 | Freq: Every day | NASAL | 6 refills | Status: DC

## 2016-09-18 MED ORDER — DM-GUAIFENESIN ER 30-600 MG PO TB12
1.0000 | ORAL_TABLET | Freq: Two times a day (BID) | ORAL | 0 refills | Status: DC
Start: 2016-09-18 — End: 2016-09-18

## 2016-09-18 MED ORDER — OXYMETAZOLINE HCL 0.05 % NA SOLN: 1.0000 | Freq: Two times a day (BID) | NASAL | 0 refills | Status: DC

## 2016-09-18 MED ORDER — DM-GUAIFENESIN ER 30-600 MG PO TB12: 1.0000 | ORAL_TABLET | Freq: Two times a day (BID) | ORAL | 0 refills | Status: DC

## 2016-09-18 NOTE — Patient Instructions (Signed)

## 2016-09-18 NOTE — Progress Notes (Signed)
   Subjective:  Lawrence Werner is a 45 y.o. male who [facility] with a chief complaint of cough. History is provided by video Arabic interpretor.   HPI:  Cough Started about a week ago. Cough is productive of green phlegm. Cough is associated with runny nose, sore throat, and ear pain. Feels like his ears are clogged. Also with low energy and lethargy. Has not tried any medications. Recently was around other people with similar symptoms. Subjective fevers.    ROS: Per HPI  Objective:  Physical Exam: BP 131/83   Pulse 70   Temp 98.3 F (36.8 C) (Oral)   Wt 202 lb (91.6 kg)   SpO2 94%   BMI 30.27 kg/m   Gen: NAD, resting comfortably HEENT: TMs clear bilaterally. OP mildly erythematous. Nasal mucosa boggy and erythematous bilaterally.  CV: RRR with no murmurs appreciated Pulm: NWOB, CTAB with no crackles, wheezes, or rhonchi MSK: no edema, cyanosis, or clubbing noted Skin: warm, dry Neuro: grossly normal, moves all extremities Psych: Normal affect and thought content  Assessment/Plan:  Viral URI with Cough Symptoms consistent with viral URI. No signs of bacterial infection. Will treat symptomatically with flonase, 3 day course of Afrin, and mucinex as needed. Return precautions reviewed. Follow up as needed.   Katina Degreealeb M. Jimmey RalphParker, MD [facility] Resident PGY-3 09/18/2016 8:47 AM

## 2016-09-23 ENCOUNTER — Other Ambulatory Visit: Payer: Self-pay | Admitting: *Deleted

## 2016-09-23 DIAGNOSIS — B372 Candidiasis of skin and nail: Secondary | ICD-10-CM

## 2016-09-23 MED ORDER — NYSTATIN 100000 UNIT/GM EX CREA: 1.0000 "application " | TOPICAL_CREAM | Freq: Two times a day (BID) | CUTANEOUS | 0 refills | Status: DC

## 2016-10-12 ENCOUNTER — Ambulatory Visit (INDEPENDENT_AMBULATORY_CARE_PROVIDER_SITE_OTHER): Payer: Medicaid Other | Admitting: Internal Medicine

## 2016-10-12 ENCOUNTER — Encounter: Payer: Self-pay | Admitting: Internal Medicine

## 2016-10-12 VITALS — BP 125/98 | HR 81 | Temp 98.2°F | Ht 68.5 in | Wt 207.0 lb

## 2016-10-12 DIAGNOSIS — N528 Other male erectile dysfunction: Secondary | ICD-10-CM | POA: Diagnosis not present

## 2016-10-12 DIAGNOSIS — R350 Frequency of micturition: Secondary | ICD-10-CM | POA: Diagnosis present

## 2016-10-12 NOTE — Patient Instructions (Signed)
Please come back tomorrow to have the labs done.  We will see you back Wednesday to discuss the lab results.  -Dr. Nancy MarusMayo

## 2016-10-12 NOTE — Progress Notes (Signed)
Left before Flu shot could given. Fleeger, Maryjo RochesterJessica Dawn, CMA

## 2016-10-12 NOTE — Assessment & Plan Note (Signed)
Pt had a UA performed 05/2016, which was normal. Likely BPH, but also need to consider prostate cancer. No PSA in our records. - Check PSA as above - Will discuss at follow-up appointment in 2 days.

## 2016-10-12 NOTE — Progress Notes (Signed)
   [facility] Phone: [phone]  Subjective:  Lawrence Werner is a 45 year old male presenting to clinic with erectile dysfunction and urinary frequency. He states he has had problems with erections for the past 4-5 years. He feels like the size of his penis is getting smaller and he does not last as long. He also feels like his erections are not as firm as they have been in the past. He states he does not have problems with his libido and he desires sexual intercourse. He does have spontaneous erections in the middle of the night and in the early morning. He denies any masses or changes in his testicles.  He endorses urinary frequency that has been going on for the last few months. He states that he instantly feels like he has to urinate again right after he has urinated. He states he does not have a weak stream. He denies dysuria. He denies bladder/bowel incontinence. No unintentional weight loss. He states he has had a DRE in the past by a physician, but it was so painful that he was screaming and the physician was never able to complete the exam.  ROS: See HPI for pertinent positives and negatives  Past Medical History- lumbar disc disease with radiculopathy, erectile dysfunction, prediabetes  Family history reviewed for today's visit. No changes.  Social history- patient is a never smoker.  Objective: There were no vitals taken for this visit. Gen: NAD, alert, cooperative with exam HEENT: NCAT, EOMI, MMM Neck: FROM, supple Resp: Normal work of breathing GU: Patient refuses Msk: No edema, warm, normal tone, moves UE/LE spontaneously Skin: No rashes, no lesions Psych: Appropriate behavior  Assessment/Plan: Erectile Dysfunction:  Pt states he does not last as long and his erections are not as hard as they used to be. He is still having spontaneous erections, so likely not vascular. He had an MRI of his lumbar spine on 08/2015 that did not show nerve compression, so I  do not think this is neurogenic. His erectile dysfunction may also be a side effect of his Cymbalta. Given his associated urinary symptoms, his erectile dysfunction may be related to his prostate. Pt adamantly refused a prostate or genital exam today. - Will check a total testosterone, TSH, and PSA tomorrow morning. - Cannot give this patient medication without doing an exam. Will discuss this more at his follow-up appointment.  - He states he could not tolerate a DRE performed by a physician in the past because it was too painful and he was screaming. Unclear if he has experienced previous trauma? May need to consider CT pelvis, as it is not normal for him to be screaming in pain during a DRE. - Can consider stopping Cymbalta to see if this helps his symptoms - Patient will follow-up in 2 days to discuss lab results.  Urinary frequency: Pt had a UA performed 05/2016, which was normal. Likely BPH, but also need to consider prostate cancer. No PSA in our records. - Check PSA as above - Will discuss at follow-up appointment in 2 days.    Lawrence CarolKaty Ashleyann Shoun, MD PGY-2

## 2016-10-12 NOTE — Assessment & Plan Note (Signed)
Pt states he does not last as long and his erections are not as hard as they used to be. He is still having spontaneous erections, so likely not vascular. He had an MRI of his lumbar spine on 08/2015 that did not show nerve compression, so I do not think this is neurogenic. His erectile dysfunction may also be a side effect of his Cymbalta. Given his associated urinary symptoms, his erectile dysfunction may be related to his prostate. Pt adamantly refused a prostate or genital exam today. - Will check a total testosterone, TSH, and PSA tomorrow morning. - Cannot give this patient medication without doing an exam. Will discuss this more at his follow-up appointment.  - He states he could not tolerate a DRE performed by a physician in the past because it was too painful and he was screaming. Unclear if he has experienced previous trauma? May need to consider CT pelvis, as it is not normal for him to be screaming in pain during a DRE. - Can consider stopping Cymbalta to see if this helps his symptoms - Patient will follow-up in 2 days to discuss lab results.

## 2016-10-13 ENCOUNTER — Other Ambulatory Visit (INDEPENDENT_AMBULATORY_CARE_PROVIDER_SITE_OTHER): Payer: Medicaid Other

## 2016-10-13 DIAGNOSIS — Z23 Encounter for immunization: Secondary | ICD-10-CM | POA: Diagnosis not present

## 2016-10-13 DIAGNOSIS — R350 Frequency of micturition: Secondary | ICD-10-CM

## 2016-10-13 DIAGNOSIS — N528 Other male erectile dysfunction: Secondary | ICD-10-CM

## 2016-10-13 LAB — PSA: PSA: 0.6 ng/mL (ref <=4.0)

## 2016-10-13 LAB — TSH: TSH: 0.78 mIU/L (ref 0.40–4.50)

## 2016-10-14 ENCOUNTER — Ambulatory Visit (INDEPENDENT_AMBULATORY_CARE_PROVIDER_SITE_OTHER): Payer: Medicaid Other | Admitting: Internal Medicine

## 2016-10-14 ENCOUNTER — Encounter: Payer: Self-pay | Admitting: Internal Medicine

## 2016-10-14 DIAGNOSIS — N529 Male erectile dysfunction, unspecified: Secondary | ICD-10-CM | POA: Diagnosis not present

## 2016-10-14 DIAGNOSIS — N400 Enlarged prostate without lower urinary tract symptoms: Secondary | ICD-10-CM | POA: Insufficient documentation

## 2016-10-14 DIAGNOSIS — N401 Enlarged prostate with lower urinary tract symptoms: Secondary | ICD-10-CM | POA: Diagnosis not present

## 2016-10-14 HISTORY — DX: Benign prostatic hyperplasia without lower urinary tract symptoms: N40.0

## 2016-10-14 LAB — TESTOSTERONE: TESTOSTERONE: 202 ng/dL — AB (ref 250–827)

## 2016-10-14 MED ORDER — FINASTERIDE 5 MG PO TABS: 5.0000 mg | ORAL_TABLET | Freq: Every day | ORAL | 2 refills | Status: DC

## 2016-10-14 NOTE — Assessment & Plan Note (Signed)
Patient is having symptoms of urinary frequency and weak stream. He has declined prostate exam in the past but agreed to a prostate exam today. His prostate is moderately enlarged and smooth. No nodules appreciated. PSA performed at his last visit and was normal at 0.6. - Will start Finasteride 5 mg daily - Patient will follow-up in one month if not improving

## 2016-10-14 NOTE — Patient Instructions (Signed)
It was nice to see you!  I have prescribed a medication called Finasteride to help your symptoms. Please take 1 tablet daily.   If your symptoms are not better, please come back to see us!  -Dr. Nancy MarusMayo

## 2016-10-14 NOTE — Assessment & Plan Note (Addendum)
Patient has had erectile dysfunction for the last 4-5 years. BPH may be contributing. Testosterone and TSH were performed at his last clinic visit. Testosterone was mildly low at 202 and TSH was normal. Low testosterone may also be contributing. There may also be a psychologic component. - Finasteride  daily started for BPH. Will see if this helps his symptoms. His erectile dysfunction may improve via a placebo effect if he has a psychologic component.  - Can consider starting Viagra, although insurance will not cover this. - Can also consider testosterone replacement, but insurance will not cover this either. - I considered starting Cialis, because this has been approved for both BPH and erectile dysfunction, but insurance will not pay for it and it is $300-$400 for a 30 day supply. Pt states this is too expensive. - Pt will follow-up in 1 month if not improving

## 2016-10-14 NOTE — Progress Notes (Signed)
   [facility] Phone: [phone]  Subjective:  Lawrence Werner is a 45 year old male presenting to clinic for follow-up of urinary frequency and erectile dysfunction. He was seen in clinic 2 days ago by me. We performed some labs including testosterone, TSH, and PSA. He declined prostate exam at that time. He is here to discuss his lab results. He has been having urinary frequency for the last 2 months. He endorses a weak stream although he stated that he did not have a weak stream 2 days ago. He has also been having erectile dysfunction for the last 5 years. He states he would like his penis to be larger. He states his penis does not get as hard as it used to.  ROS: See HPI for pertinent positives and negatives  Past Medical History- lumbar disc disease with radiculopathy, erectile dysfunction, prediabetes, urinary frequency.   Family history reviewed for today's visit. No changes.  Social history- patient is a never smoker.  Objective: BP (!) 144/81   Pulse 75   Temp 98.1 F (36.7 C) (Oral)   Wt 209 lb (94.8 kg)   SpO2 100%   BMI 31.32 kg/m  Gen: NAD, alert, cooperative with exam HEENT: NCAT, EOMI, MMM Neck: FROM, supple Prostate: Prostate with mild/moderate enlargement. Prostate is smooth. No nodules appreciated.  Assessment/Plan: BPH: Patient is having symptoms of urinary frequency and weak stream. No urinary obstruction. He has declined prostate exam in the past but agreed to a prostate exam today. His prostate is moderately enlarged and smooth. No nodules appreciated. PSA performed at his last visit and was normal at 0.6. - Will start Finasteride 5 mg daily - Patient will follow-up in one month if not improving  Erectile Dysfunction: Patient has had erectile dysfunction for the last 4-5 years. BPH may be contributing. Testosterone and TSH were performed at his last clinic visit. Testosterone was mildly low at 202 and TSH was normal. Low testosterone may  also be contributing. There may also be a psychologic component. - Finasteride 5mg  daily started for BPH. Will see if this helps his symptoms. His erectile dysfunction may improve via a placebo effect if he has a psychologic component.  - Can consider starting Viagra, although insurance will not cover this. - Can also consider testosterone replacement, but insurance will not cover this either. - I considered starting Cialis, because this has been approved for both BPH and erectile dysfunction, but insurance will not pay for it and it is $300-$400 for a 30 day supply. Pt states this is too expensive. - Pt will follow-up in 1 month if not improving   Lawrence CarolKaty Mayo, MD PGY-2

## 2016-10-16 ENCOUNTER — Ambulatory Visit: Payer: Medicaid Other | Admitting: Internal Medicine

## 2016-11-03 ENCOUNTER — Emergency Department (HOSPITAL_COMMUNITY)
Admission: EM | Admit: 2016-11-03 | Discharge: 2016-11-03 | Disposition: A | Discharge status: Home / Self Care | Payer: Medicaid Other | Attending: Emergency Medicine | Admitting: Emergency Medicine

## 2016-11-03 ENCOUNTER — Emergency Department (HOSPITAL_COMMUNITY): Payer: Medicaid Other

## 2016-11-03 DIAGNOSIS — Z7729 Contact with and (suspected ) exposure to other hazardous substances: Secondary | ICD-10-CM | POA: Diagnosis not present

## 2016-11-03 DIAGNOSIS — R0789 Other chest pain: Secondary | ICD-10-CM | POA: Diagnosis present

## 2016-11-03 DIAGNOSIS — Z77128 Contact with and (suspected) exposure to other hazards in the physical environment: Secondary | ICD-10-CM

## 2016-11-03 LAB — BASIC METABOLIC PANEL
Anion gap: 6 (ref 5–15)
BUN: 13 mg/dL (ref 6–20)
CALCIUM: 9.8 mg/dL (ref 8.9–10.3)
CHLORIDE: 107 mmol/L (ref 101–111)
CO2: 26 mmol/L (ref 22–32)
CREATININE: 0.78 mg/dL (ref 0.61–1.24)
GFR calc non Af Amer: >60 mL/min (ref >60)
Glucose, Bld: 136 mg/dL — ABNORMAL HIGH (ref 65–99)
Potassium: 3.9 mmol/L (ref 3.5–5.1)
SODIUM: 139 mmol/L (ref 135–145)

## 2016-11-03 LAB — CBC
HCT: 41.6 % (ref 39.0–52.0)
Hemoglobin: 13.6 g/dL (ref 13.0–17.0)
MCH: 26.5 pg (ref 26.0–34.0)
MCHC: 32.7 g/dL (ref 30.0–36.0)
MCV: 80.9 fL (ref 78.0–100.0)
PLATELETS: 307 10*3/uL (ref 150–400)
RBC: 5.14 MIL/uL (ref 4.22–5.81)
RDW: 14 % (ref 11.5–15.5)
WBC: 7 10*3/uL (ref 4.0–10.5)

## 2016-11-03 LAB — I-STAT TROPONIN, ED: TROPONIN I, POC: 0 ng/mL (ref 0.00–0.08)

## 2016-11-03 MED ORDER — ALBUTEROL SULFATE HFA 108 (90 BASE) MCG/ACT IN AERS: 2.0000 | INHALATION_SPRAY | Freq: Four times a day (QID) | RESPIRATORY_TRACT | 2 refills | Status: DC | PRN

## 2016-11-03 NOTE — ED Notes (Signed)
Pt speaks english, however Arabic is his native language and would like a Nurse, learning disabilitytranslator.

## 2016-11-03 NOTE — ED Provider Notes (Signed)
[facility]EMER[facility]ENCY DEPT Provider Note   CSN: [id number] Arrival date & time: 11/03/16  1247     History   Chief Complaint Chief Complaint  Patient presents with  . Chest Pain    HPI Lawrence Werner is a 45 y.o. male.  HPI Pt comes in with cc of difficulty breathing and some chest discomfort. Symptoms x 20 days, and are new. The symptoms are only present when he is in the new house he is renting. The discomfort is midsternal. There is no associated cough or wheezing. Pt denies any hx of similar symptoms or lung disease.  Pt denies any exertional dyspnea or chest pain.   No past medical history on file.  Patient Active Problem List   Diagnosis Date Noted  . BPH (benign prostatic hyperplasia) 10/14/2016  . Urinary frequency 06/17/2016  . Candidiasis, cutaneous 05/30/2016  . Prediabetes 05/30/2016  . Lumbar disc disease with radiculopathy 09/20/2015  . Refugee health examination 09/20/2015  . Erectile dysfunction 09/20/2015  . Hematemesis with nausea 08/31/2015    No past surgical history on file.     Home Medications    Prior to Admission medications   Medication Sig Start Date End Date Taking? Authorizing Provider  albuterol (PROVENTIL HFA;VENTOLIN HFA) 108 (90 Base) MCG/ACT inhaler Inhale 2 puffs into the lungs every 6 (six) hours as needed for wheezing or shortness of breath. 11/03/16   Derwood KaplanAnkit Kenasia Scheller, MD  dextromethorphan-guaiFENesin (MUCINEX DM) 30-600 MG 12hr tablet Take 1 tablet by mouth 2 (two) times daily. 09/18/16   Ardith Darkaleb M Parker, MD  DULoxetine (CYMBALTA) 60 MG capsule Take 1 capsule (60 mg total) by mouth daily. 02/27/16   Narda Bondsalph A Nettey, MD  finasteride (PROSCAR) 5 MG tablet Take 1 tablet (5 mg total) by mouth daily. 10/14/16   Campbell StallKaty Dodd Mayo, MD  fluticasone (FLONASE) 50 MCG/ACT nasal spray Place 2 sprays into both nostrils daily. 09/18/16   Ardith Darkaleb M Parker, MD  gabapentin (NEURONTIN) 300 MG capsule Take 1 capsule (300 mg total) by mouth 3 (three) times  daily. Take one pill today, two tomorrow then take three times daily 01/23/16   Narda Bondsalph A Nettey, MD  lactobacillus acidophilus & bulgar (LACTINEX) chewable tablet Chew 1 tablet by mouth 3 (three) times daily with meals. 05/20/16   Chase PicketJaime Pilcher Ward, PA-C  nystatin cream (MYCOSTATIN) Apply 1 application topically 2 (two) times daily. 09/23/16   Renne Muscaaniel L Warden, MD  omeprazole (PRILOSEC) 40 MG capsule Take 1 capsule (40 mg total) by mouth daily. 08/26/15   Narda Bondsalph A Nettey, MD  oxymetazoline (AFRIN NASAL SPRAY) 0.05 % nasal spray Place 1 spray into both nostrils 2 (two) times daily. DO NOT use for more than 3 days. 09/18/16   Ardith Darkaleb M Parker, MD    Family History No family history on file.  Social History Social History  Substance Use Topics  . Smoking status: Never Smoker  . Smokeless tobacco: Not on file  . Alcohol use No     Allergies   Patient has no known allergies.   Review of Systems Review of Systems  Constitutional: Negative for activity change and appetite change.  Respiratory: Positive for shortness of breath. Negative for cough.   Cardiovascular: Positive for chest pain.  Gastrointestinal: Negative for abdominal pain.  Genitourinary: Negative for dysuria.     Physical Exam Updated Vital Signs BP 129/77 (BP Location: Right Arm)   Pulse 80   Temp 98.8 F (37.1 C)   Resp 16   SpO2 98%  Physical Exam  Constitutional: He is oriented to person, place, and time. He appears well-developed.  HENT:  Head: Atraumatic.  Neck: Neck supple.  Cardiovascular: Normal rate.   Pulmonary/Chest: Effort normal. He has no wheezes. He has no rales.  Neurological: He is alert and oriented to person, place, and time.  Skin: Skin is warm.  Nursing note and vitals reviewed.    ED Treatments / Results  Labs (all labs ordered are listed, but only abnormal results are displayed) Labs Reviewed  BASIC METABOLIC PANEL - Abnormal; Notable for the following:       Result Value   Glucose,  Bld 136 (*)    All other components within normal limits  CBC  I-STAT TROPOININ, ED  I-STAT TROPOININ, ED    EKG  EKG Interpretation  Date/Time:  Tuesday November 03 2016 12:53:28 EST Ventricular Rate:  74 PR Interval:  136 QRS Duration: 88 QT Interval:  368 QTC Calculation: 408 R Axis:   59 Text Interpretation:  Normal sinus rhythm Normal ECG No acute changes No significant change since last tracing Confirmed by Rhunette CroftNANAVATI, MD, Janey GentaANKIT [phone]([id number]) on 11/03/2016 5:29:22 PM       Radiology Dg Chest 2 View  Result Date: 11/03/2016 CLINICAL DATA:  Chest pain short of breath EXAM: CHEST  2 VIEW COMPARISON:  None. FINDINGS: The heart size and mediastinal contours are within normal limits. Both lungs are clear. The visualized skeletal structures are unremarkable. IMPRESSION: No active cardiopulmonary disease. Electronically Signed   By: Marlan Palauharles  Clark M.D.   On: 11/03/2016 14:15    Procedures Procedures (including critical care time)  Medications Ordered in ED Medications - No data to display   Initial Impression / Assessment and Plan / ED Course  I have reviewed the triage vital signs and the nursing notes.  Pertinent labs & imaging results that were available during my care of the patient were reviewed by me and considered in my medical decision making (see chart for details).  Clinical Course     Pt comes in with cc of chest pain and dib. Seems to be hypersensitivity to the some environmental source in the house, as he reports foul smell. Will give a bronchodilator. Workup here is normal. Lung exam is clear. SW to alert case management for FM - pt to see FM tomorrow.  Final Clinical Impressions(s) / ED Diagnoses   Final diagnoses:  Exposure to environmental hazard    New Prescriptions New Prescriptions   ALBUTEROL (PROVENTIL HFA;VENTOLIN HFA) 108 (90 BASE) MCG/ACT INHALER    Inhale 2 puffs into the lungs every 6 (six) hours as needed for wheezing or shortness of breath.      Derwood KaplanAnkit Suad Autrey, MD 11/03/16 1737

## 2016-11-03 NOTE — ED Notes (Signed)
NAD at this time. Pt is stable and going home.  

## 2016-11-03 NOTE — ED Triage Notes (Addendum)
Arabic interpreter used- Pt reports headache, chest pain, congestion and ear pain for 2 weeks.

## 2016-11-03 NOTE — Discharge Instructions (Signed)
PLEASE SEE YOUR FAMILY DOCTOR TOMORROW AS PLANNED, AND SEE IF THE CASE MANAGER OR SOCIAL WORK CAN HELP YOU FIGURE OUT WHAT TO DO.

## 2016-11-04 ENCOUNTER — Ambulatory Visit (INDEPENDENT_AMBULATORY_CARE_PROVIDER_SITE_OTHER): Payer: Medicaid Other | Admitting: Family Medicine

## 2016-11-04 ENCOUNTER — Encounter: Payer: Self-pay | Admitting: Family Medicine

## 2016-11-04 VITALS — BP 117/77 | HR 86 | Temp 98.1°F | Wt 206.0 lb

## 2016-11-04 DIAGNOSIS — N529 Male erectile dysfunction, unspecified: Secondary | ICD-10-CM

## 2016-11-04 MED ORDER — SILDENAFIL CITRATE 100 MG PO TABS: 50.0000 mg | ORAL_TABLET | Freq: Every day | ORAL | 11 refills | Status: AC | PRN

## 2016-11-04 NOTE — Progress Notes (Signed)
   Subjective:   Lawrence Werner is a 45 y.o. male with a history of Prediabetes, BPH here for same day appointment for erectile dysfunction  10d ago came to Dr Nancy MarusMayo Started finasteride 5mg  daily for BPH Urination has improved  Erection not as strong and full as before x5 years Not able to afford any copay for medications Reports this is very distressing because he wants to have more children and this is getting in the way He had 6 children die in the [place]   Review of Systems:  Per HPI.   Social History: never smoker  Objective:  BP 117/77   Pulse 86   Temp 98.1 F (36.7 C) (Oral)   Wt 206 lb (93.4 kg)   BMI 30.87 kg/m   Gen:  45 y.o. male in NAD  HEENT: NCAT, MMM, EOMI, PERRL, anicteric sclerae CV: RRR, no MRG Resp: Non-labored, CTAB, no wheezes noted Abd: Soft, NTND, BS present, no guarding or organomegaly Ext: WWP, no edema MSK: Gait intact, no obvious deformities Neuro: Alert and oriented, speech normal     Assessment & Plan:     Lawrence Werner is a 45 y.o. male here for   Erectile dysfunction Discussed at length with patient He's had erectile dysfunction for the last 5 years Was recently started on finasteride, but not improving Discussed possibility of Viagra but that this would not be covered by insurance Patient is adamant that I need to help him and that he cannot afford the medication Explained that this is a problem for many patients Viagra sent to pharmacy for patient to discuss price with pharmacist Referral placed for urology to discuss any other options available   Erasmo DownerAngela M Fallyn Munnerlyn, MD MPH PGY-3,  [facility] 11/04/2016  11:36 AM

## 2016-11-04 NOTE — Assessment & Plan Note (Signed)
Discussed at length with patient He's had erectile dysfunction for the last 5 years Was recently started on finasteride, but not improving Discussed possibility of Viagra but that this would not be covered by insurance Patient is adamant that I need to help him and that he cannot afford the medication Explained that this is a problem for many patients Viagra sent to pharmacy for patient to discuss price with pharmacist Referral placed for urology to discuss any other options available

## 2017-03-30 IMAGING — MR MR LUMBAR SPINE W/O CM
4 of 5 series · 19 of 48 positions shown · non-contrast
Comparison: 08/05/2015 CT abdomen and pelvis.

CLINICAL DATA: 44-year-old male with lower back pain for 8 years
extending down both legs with numbness and weakness in both legs.
Initial encounter.

EXAM:
MRI LUMBAR SPINE WITHOUT CONTRAST
TECHNIQUE: Multiplanar, multisequence MR imaging of the lumbar spine was
performed. No intravenous contrast was administered.

[Series 300: T2 · sagittal · 4.0mm · 0.55mm/px · 6 of 13 slices shown (1 of 2)]
[im 1/13]
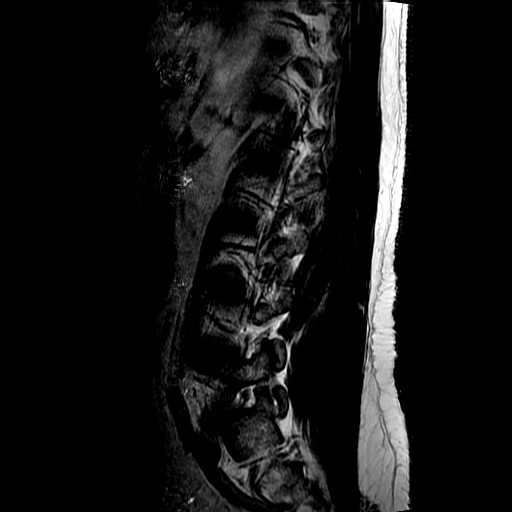
[im 3/13]
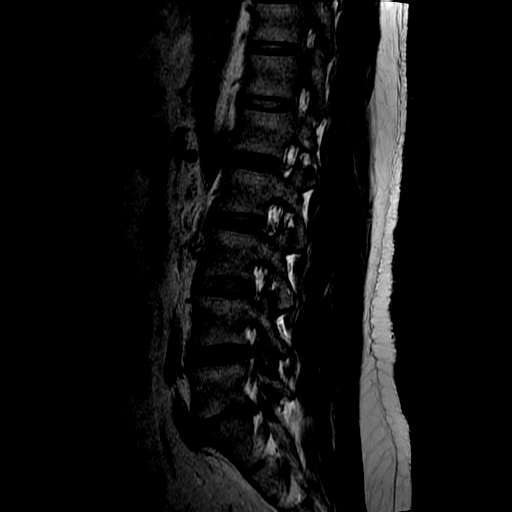
[im 5/13]
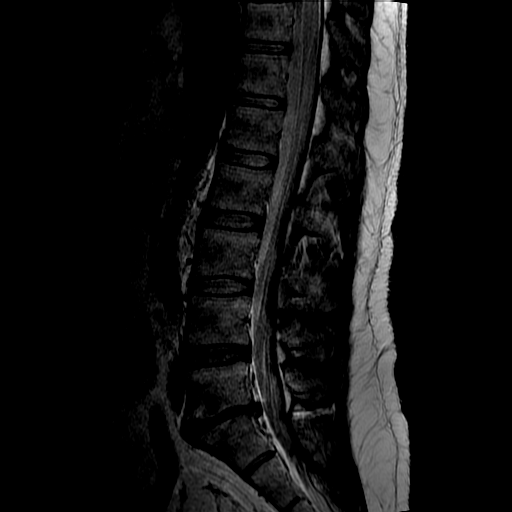
[im 8/13]
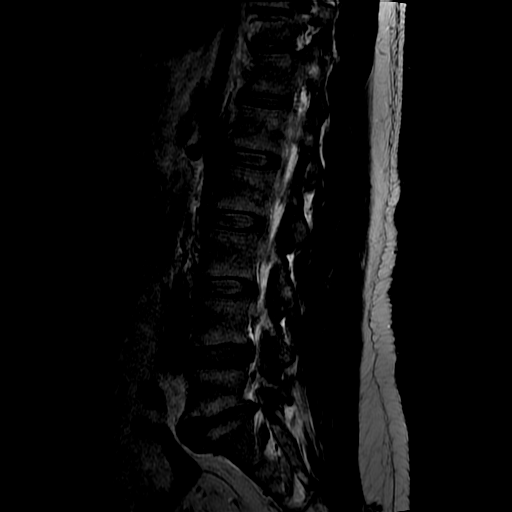
[im 10/13]
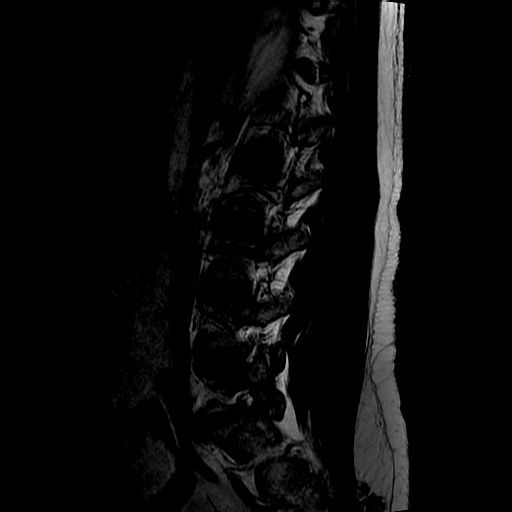
[im 13/13]
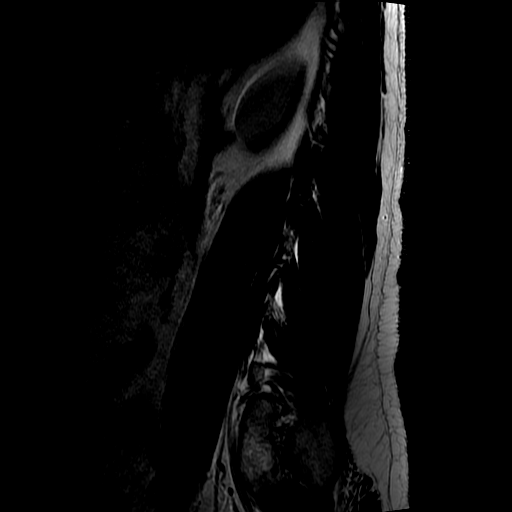

[Series 500: T1 · sagittal · 4.0mm · 0.55mm/px · 3 of 13 slices shown (1 of 2)]
[im 3/13]
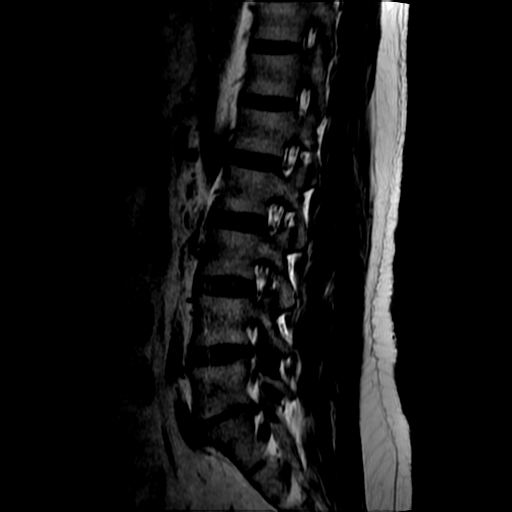
[im 8/13]
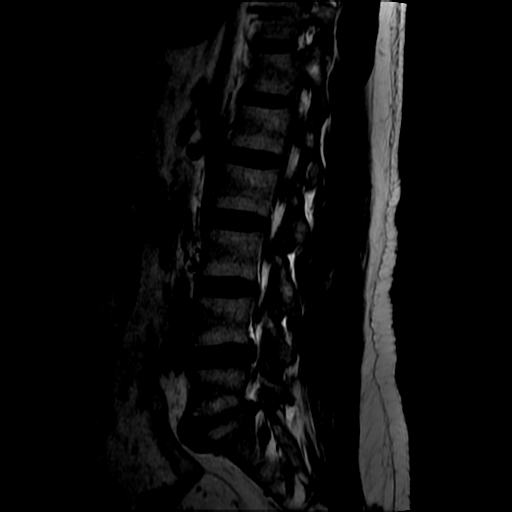
[im 13/13]
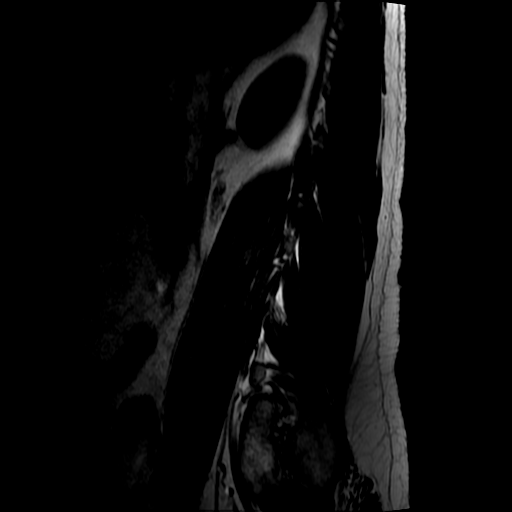

[Series 600: T2 · axial · 4.0mm · 0.39mm/px · z∈[-136,+29]mm · 7 of 33 slices shown (2 of 2)]
[im 1/33]
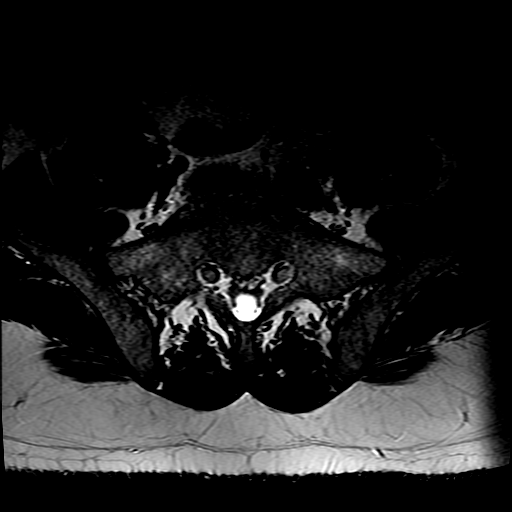
[im 5/33]
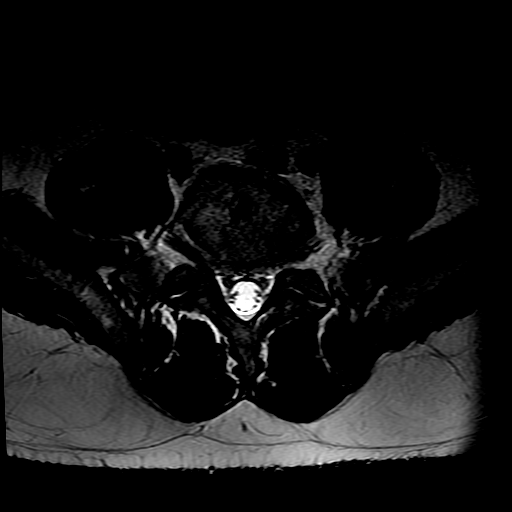
[im 10/33]
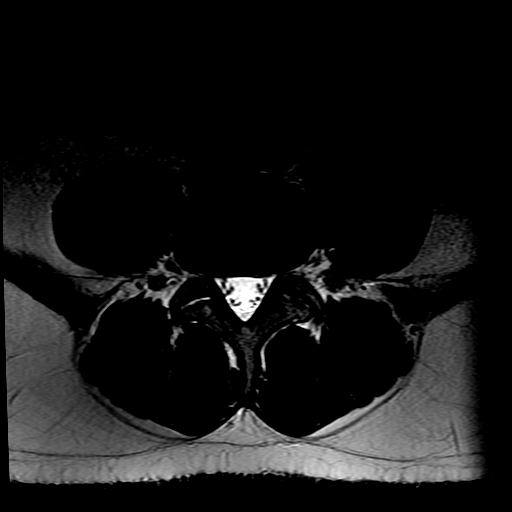
[im 14/33]
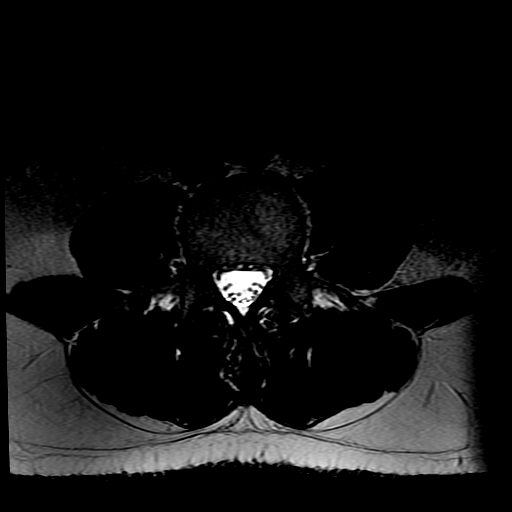
[im 17/33]
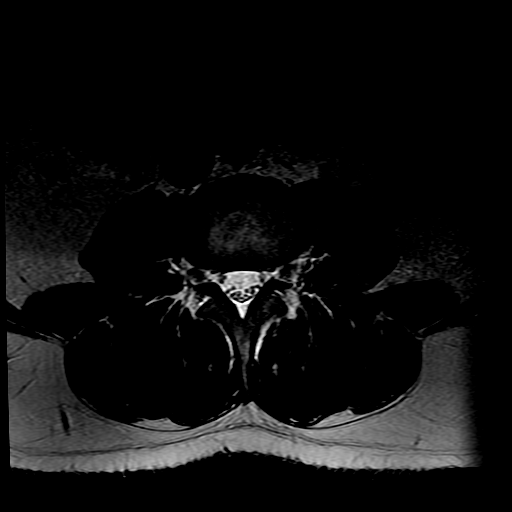
[im 19/33]
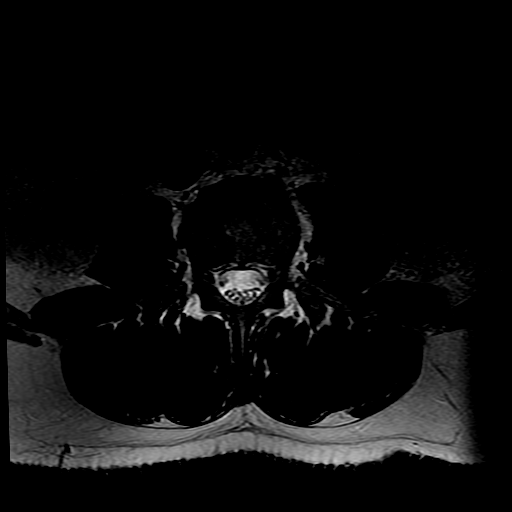
[im 28/33]
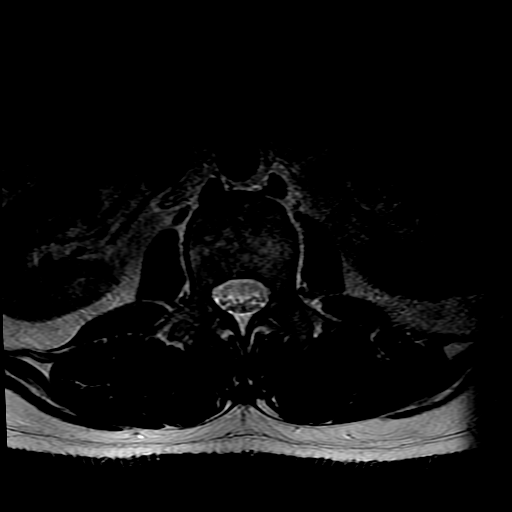

[Series 700: T1 · axial · 4.0mm · 0.39mm/px · z∈[-117,+29]mm · 3 of 33 slices shown (2 of 2)]
[im 5/33]
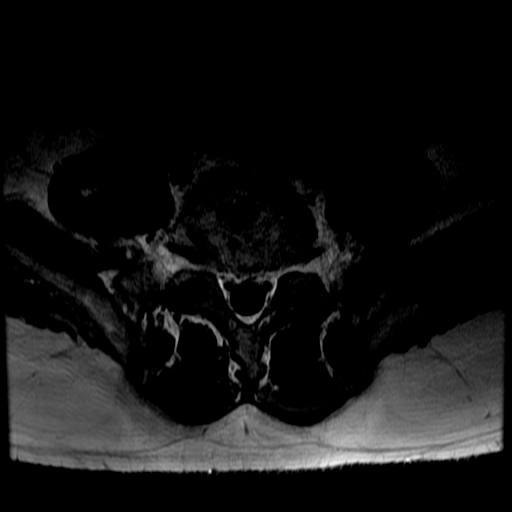
[im 17/33]
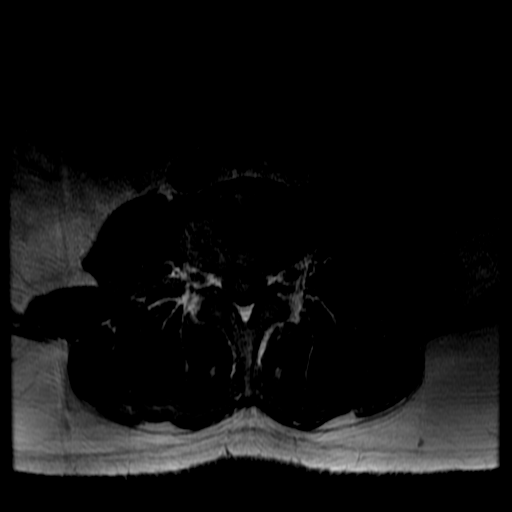
[im 28/33]
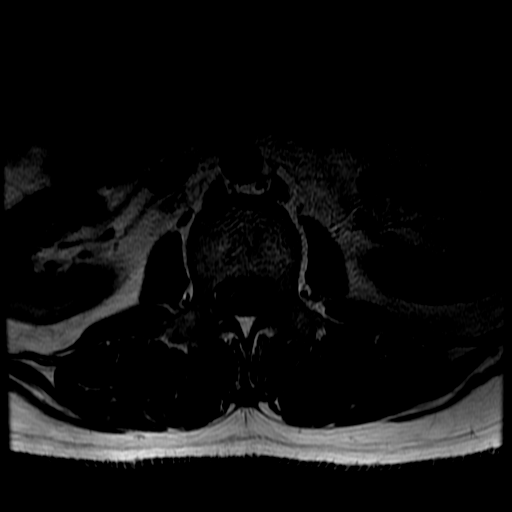

[19 of 48 positions shown; findings below may reference images not displayed]

FINDINGS: Exam is motion degraded.

Last fully open disk space is labeled L5-S1. Present examination
incorporates from T11-12 disc space through the upper S3 level.

Conus T12-L1 level.

Visualized paravertebral structures unremarkable.

T11-12 through L3-4 unremarkable.

L4-5: Small right foraminal protrusion approaches but does not
compress the exiting right L4 nerve root. Small annular fissure.
Minimal facet joint degenerative changes greater on the right.

L5-S1: Schmorl's node deformity with endplate reactive changes. Disc
degeneration with disc space narrowing with broad-based disc
osteophyte complex. Foraminal extension greater on the right with
mild encroachment upon the exiting right L5 nerve root. Centrally,
disc osteophyte with mild contact with the upper S1 nerve roots
slightly greater on the left. No significant thecal sac compromise.
Mild facet degenerative changes.
IMPRESSION: L5-S1 broad-based disc osteophyte complex. Foraminal extension
greater on the right with mild encroachment upon the exiting right
L5 nerve root. Centrally, disc osteophyte with mild contact with the
upper S1 nerve roots slightly greater on the left.

L4-5 small right foraminal protrusion approaches but does not
compress the exiting right L4 nerve root.

## 2017-08-24 ENCOUNTER — Ambulatory Visit (INDEPENDENT_AMBULATORY_CARE_PROVIDER_SITE_OTHER): Payer: Self-pay | Admitting: Family Medicine

## 2017-08-24 VITALS — BP 138/88 | HR 67 | Temp 97.6°F | Ht 68.5 in | Wt 205.0 lb

## 2017-08-24 DIAGNOSIS — R7309 Other abnormal glucose: Secondary | ICD-10-CM

## 2017-08-24 DIAGNOSIS — R42 Dizziness and giddiness: Secondary | ICD-10-CM

## 2017-08-24 DIAGNOSIS — K219 Gastro-esophageal reflux disease without esophagitis: Secondary | ICD-10-CM | POA: Insufficient documentation

## 2017-08-24 LAB — GLUCOSE, POCT (MANUAL RESULT ENTRY): POC Glucose: 119 mg/dl — AB (ref 70–99)

## 2017-08-24 LAB — POCT GLYCOSYLATED HEMOGLOBIN (HGB A1C): HEMOGLOBIN A1C: 6.2

## 2017-08-24 MED ORDER — OMEPRAZOLE 40 MG PO CPDR: 40.0000 mg | DELAYED_RELEASE_CAPSULE | Freq: Every day | ORAL | 99 refills | Status: DC

## 2017-08-24 NOTE — Patient Instructions (Addendum)
Please Take the Omeprazole 40 mg daily for two weeks at a time when your colon bothers you.   I believe you have Gastroesophageal Reflux Esophagitis Disease.

## 2017-08-24 NOTE — Assessment & Plan Note (Signed)
New recurrence of problem Recommend patient initial therapy omeprazole 40 mg daily for 6 to 8 weeks. Then 2 week courses omeprazole when has flare. If not improved in two weeks, return to clinic for re-evaluation.  Dietary precautions discussed. Weight loss role in control symptoms discussed.

## 2017-08-24 NOTE — Progress Notes (Signed)
Lawrence Werner is alone Sources of clinical information for visit is/are patient and past medical records. Nursing assessment for this office visit was reviewed with the patient for accuracy and revision.   Video interpreter: Mawar (Arabic) ABDOMINAL PAIN "Whenever I feel like this it is my colon" "I got a pill from this office and when I took it I got better"  Location: epigastric  Onset: on and off for about last six months, most recent attack started 4 days ago.   Radiation: no radiation.  No radiation into back or shoulders.    Severity: moderate to severe Quality: burning sensation Pattern: intermittent, Course: worsening over last couple of days   Better with: Had two omeprazole capsules left from 07/2015 prescription which he took with good relief of pain.  Worse with: When he is anxious.  Some foods worsen discomfort and give him bloating feeling PMH: Pt seen in [place] 07/2015 for burning epigastric pain.  Abdominopelvic CT at that time unremarkable. Treated with GI cocktail for acute gastritis with good relief.  Patient seen 5 days later at [facility] by Dr Caleb Popp with hematemesis.  Prescribed omeprazole 40 mg daily #30 tablets with 3 refills. Pt reports to me today that this medication helped him a great deal.   Symptoms Nausea/Vomiting: no  Diarrhea: no  Constipation: no  Melena/BRBPR: no  Hematemesis: no  Anorexia: no  Fever/Chills: no  Dysuria: no  Rash: no  Wt loss: no  EtOH use: no  NSAIDs/ASA: no     Past Surgeries: none  SH: no smoking.  Married with children  ROS: See HPI  Physical Exam Vitals:   08/24/17 0954  BP: 138/88  Pulse: 67  Temp: 97.6 F (36.4 C)  SpO2: 98%  CBG 119 mg/dL (random)  Gen: Obese, talkative, initially anxious with leg tapping which resolved by end of visit. Joking at end of visit. Groomed.  Cooperative, No juandice, no scleral icterus.  Lung: BCTA, no acc mm use Cor: RRR, No M/G/R Abdomen: mild epigastric tenderness,  ND, (+) BS, No Liver edge palpated, Negative Murphy sign.   A/P See Problem list

## 2017-11-29 ENCOUNTER — Encounter: Payer: Self-pay | Admitting: Internal Medicine

## 2017-11-29 ENCOUNTER — Other Ambulatory Visit: Payer: Self-pay

## 2017-11-29 ENCOUNTER — Ambulatory Visit: Payer: Medicaid Other | Admitting: Internal Medicine

## 2017-11-29 VITALS — BP 112/61 | HR 64 | Temp 98.3°F | Wt 201.0 lb

## 2017-11-29 DIAGNOSIS — L918 Other hypertrophic disorders of the skin: Secondary | ICD-10-CM

## 2017-11-29 DIAGNOSIS — R7309 Other abnormal glucose: Secondary | ICD-10-CM

## 2017-11-29 DIAGNOSIS — Z23 Encounter for immunization: Secondary | ICD-10-CM

## 2017-11-29 LAB — POCT GLYCOSYLATED HEMOGLOBIN (HGB A1C): HEMOGLOBIN A1C: 6

## 2017-11-29 MED ORDER — TRIAMCINOLONE ACETONIDE 0.025 % EX OINT: 1.0000 "application " | TOPICAL_OINTMENT | Freq: Two times a day (BID) | CUTANEOUS | 0 refills | Status: DC

## 2017-11-29 NOTE — Patient Instructions (Signed)
It was so nice to see you!  Please follow-up with your primary care doctor for skin tag removal. I have sent in a prescription for a cream to help with itching.  -Dr. Nancy MarusMayo

## 2017-12-01 DIAGNOSIS — L918 Other hypertrophic disorders of the skin: Secondary | ICD-10-CM | POA: Insufficient documentation

## 2017-12-01 NOTE — Progress Notes (Signed)
   [facility] Phone: [phone]  Subjective:  Lawrence Werner is a 46 year old male presenting to clinic with skin tags on his head, face, and neck for the last month. The skin tags are starting to itch. He also notes a mole on the left side of his head. He states that he would like a cream to remove the skin tags. He has not noticed skin tags elsewhere on his body.  ROS: See HPI for pertinent positives and negatives  Past Medical History- GERD, prediabetes  Family history reviewed for today's visit. No changes.  Social history- patient is a never smoker  Objective: BP 112/61   Pulse 64   Temp 98.3 F (36.8 C) (Oral)   Wt 201 lb (91.2 kg)   SpO2 98%   BMI 30.12 kg/m  Gen: NAD, alert, cooperative with exam Skin: Multiple small skin tags noted on the neck, 1-2 skin tags present on the lower lip, 0.5cm x 1cm stuck-on lesion present on the left upper forehead.  Assessment/Plan: Skin Tags: Patient has had multiple skin tags pop up on the neck over the last month. Wants them removed. Discussed that this is a cosmetic procedure, so he will likely have to pay out of pocket for this procedure. - Check A1c to rule out worsening blood sugar as a cause of increased skin tags - Prescribed Kenalog ointment to help with itching - Appointment scheduled with PCP on 12/10 for skin tag removal.   Willadean CarolKaty Mayo, MD PGY-3

## 2017-12-01 NOTE — Assessment & Plan Note (Signed)
Patient has had multiple skin tags pop up on the neck over the last month. Wants them removed. Discussed that this is a cosmetic procedure, so he will likely have to pay out of pocket for this procedure. - Check A1c to rule out worsening blood sugar as a cause of increased skin tags - Prescribed Kenalog ointment to help with itching - Appointment scheduled with PCP on 12/10 for skin tag removal.

## 2017-12-06 ENCOUNTER — Ambulatory Visit: Payer: Medicaid Other | Admitting: Family Medicine

## 2019-09-19 ENCOUNTER — Ambulatory Visit: Payer: Medicaid Other | Admitting: Physical Therapy

## 2019-09-26 ENCOUNTER — Ambulatory Visit: Payer: Medicaid Other | Admitting: Physical Therapy

## 2020-11-01 ENCOUNTER — Encounter (HOSPITAL_COMMUNITY): Payer: Self-pay | Admitting: Urgent Care

## 2020-11-01 ENCOUNTER — Ambulatory Visit (HOSPITAL_COMMUNITY)
Admission: EM | Admit: 2020-11-01 | Discharge: 2020-11-01 | Disposition: A | Discharge status: Home / Self Care | Payer: Medicaid Other | Attending: Urgent Care | Admitting: Urgent Care

## 2020-11-01 ENCOUNTER — Other Ambulatory Visit: Payer: Self-pay

## 2020-11-01 DIAGNOSIS — K219 Gastro-esophageal reflux disease without esophagitis: Secondary | ICD-10-CM

## 2020-11-01 DIAGNOSIS — R1010 Upper abdominal pain, unspecified: Secondary | ICD-10-CM

## 2020-11-01 MED ORDER — OMEPRAZOLE 40 MG PO CPDR: 40.0000 mg | DELAYED_RELEASE_CAPSULE | Freq: Every day | ORAL | 0 refills | Status: DC

## 2020-11-01 NOTE — ED Triage Notes (Signed)
Pt presents with right upper quadrant  abdominal pain for the past 4 years, worsens in the past  3-4 days. States the pqain just happens with cold weather. States omeprazole 40 mg gives relief. Denies chest pain,  fever, nausea, diarrhea. Pt requested omeprazole refill as the prescription he has is expired.

## 2020-11-01 NOTE — ED Provider Notes (Signed)
[facility]   MRN: [id number] DOB: 03/02/1971  Subjective:   Lawrence Werner is a 49 y.o. male presenting for 3-4 day hx of acute on chronic upper abdominal pain. Omeprazole helps quite well with his abdominal pain. Denies hx of PUD, heart conditions. Denies fever, n/v, diarrhea, bloody stools. Denies chest pain, cough. Does not drink alcohol. Eats a large mix of foods, not actively healthy diet. Drinks soda, tea but also water.   No current facility-administered medications for this encounter.  Current Outpatient Medications:  .  gabapentin (NEURONTIN) 300 MG capsule, Take 1 capsule (300 mg total) by mouth 3 (three) times daily. Take one pill today, two tomorrow then take three times daily, Disp: 90 capsule, Rfl: 3 .  lactobacillus acidophilus & bulgar (LACTINEX) chewable tablet, Chew 1 tablet by mouth 3 (three) times daily with meals., Disp: 21 tablet, Rfl: 0 .  omeprazole (PRILOSEC) 40 MG capsule, Take 1 capsule (40 mg total) by mouth daily., Disp: 30 capsule, Rfl: PRN .  sildenafil (VIAGRA) 100 MG tablet, Take 0.5-1 tablets (50-100 mg total) by mouth daily as needed for erectile dysfunction., Disp: 5 tablet, Rfl: 11 .  triamcinolone (KENALOG) 0.025 % ointment, Apply 1 application topically 2 (two) times daily., Disp: 30 g, Rfl: 0   No Known Allergies  Past Medical History:  Diagnosis Date  . BPH (benign prostatic hyperplasia) 10/14/2016  . Candidiasis, cutaneous 05/30/2016  . Erectile dysfunction 09/20/2015  . Hematemesis with nausea 08/31/2015  . Lumbar disc disease with radiculopathy 09/20/2015  . Refugee health examination 09/20/2015  . Urinary frequency 06/17/2016     History reviewed. No pertinent surgical history.  History reviewed. No pertinent family history.  Social History   Tobacco Use  . Smoking status: Never Smoker  . Smokeless tobacco: Never Used  Substance Use Topics  . Alcohol use: No  . Drug use: No    ROS   Objective:    Vitals: BP (!) 148/87 (BP Location: Right Arm)   Pulse 69   Temp 97.7 F (36.5 C) (Oral)   Resp 18   SpO2 98%   Physical Exam Constitutional:      General: He is not in acute distress.    Appearance: Normal appearance. He is well-developed. He is not ill-appearing, toxic-appearing or diaphoretic.  HENT:     Head: Normocephalic and atraumatic.     Right Ear: External ear normal.     Left Ear: External ear normal.     Nose: Nose normal.     Mouth/Throat:     Mouth: Mucous membranes are moist.     Pharynx: Oropharynx is clear.  Eyes:     General: No scleral icterus.       Right eye: No discharge.        Left eye: No discharge.     Extraocular Movements: Extraocular movements intact.     Conjunctiva/sclera: Conjunctivae normal.     Pupils: Pupils are equal, round, and reactive to light.  Cardiovascular:     Rate and Rhythm: Normal rate and regular rhythm.     Heart sounds: Normal heart sounds. No murmur heard.  No friction rub. No gallop.   Pulmonary:     Effort: Pulmonary effort is normal. No respiratory distress.     Breath sounds: Normal breath sounds. No stridor. No wheezing, rhonchi or rales.  Abdominal:     General: Bowel sounds are normal. There is no distension.     Palpations: Abdomen is soft. There  is no mass.     Tenderness: There is abdominal tenderness in the epigastric area. There is no guarding or rebound.  Skin:    General: Skin is warm and dry.  Neurological:     Mental Status: He is alert and oriented to person, place, and time.  Psychiatric:        Mood and Affect: Mood normal.        Behavior: Behavior normal.        Thought Content: Thought content normal.        Judgment: Judgment normal.      Assessment and Plan :   PDMP not reviewed this encounter.  1. Gastroesophageal reflux disease, unspecified whether esophagitis present   2. Pain of upper abdomen   3. Gastroesophageal reflux disease     Refilled his omeprazole. Counseled on  dietary modifications and avoidance of GERD causing foods. Follow up with PCP and/or GI specialist. Patient is due for colonoscopy anyhow but may benefit from an endoscopy. Counseled patient on potential for adverse effects with medications prescribed/recommended today, ER and return-to-clinic precautions discussed, patient verbalized understanding.    Wallis Bamberg, PA-C 11/01/20 1124

## 2020-11-30 ENCOUNTER — Encounter (HOSPITAL_COMMUNITY): Payer: Self-pay

## 2020-11-30 ENCOUNTER — Other Ambulatory Visit: Payer: Self-pay

## 2020-11-30 ENCOUNTER — Ambulatory Visit (HOSPITAL_COMMUNITY)
Admission: EM | Admit: 2020-11-30 | Discharge: 2020-11-30 | Disposition: A | Discharge status: Home / Self Care | Payer: Medicaid Other | Attending: Emergency Medicine | Admitting: Emergency Medicine

## 2020-11-30 DIAGNOSIS — H9203 Otalgia, bilateral: Secondary | ICD-10-CM

## 2020-11-30 DIAGNOSIS — R42 Dizziness and giddiness: Secondary | ICD-10-CM

## 2020-11-30 MED ORDER — PREDNISONE 10 MG (21) PO TBPK: ORAL_TABLET | Freq: Every day | ORAL | 0 refills | Status: DC

## 2020-11-30 NOTE — ED Notes (Signed)
During triage pt was rude, and denied answering screening questions. He stated he did not have a disease and did not want to answer anymore questions.

## 2020-11-30 NOTE — ED Triage Notes (Signed)
Pt presents with Bilateral ear infection. Pt states the ear infection has been on and off x 1 year. Pt states he has been dizzy x 3 days. Pt states he has been diagnosed with inflammation of the middle ear.  Pt states that he has been prescribed Amoxicillin, Tramadol and Meclizine and the medications are not helping relieve the sxs.

## 2020-11-30 NOTE — Discharge Instructions (Signed)
Take the prednisone as directed.  Follow up with your primary care provider if your symptoms are not improving.    

## 2020-11-30 NOTE — ED Provider Notes (Signed)
[facility]    CSN: [id number] Arrival date & time: 11/30/20  1019      History   Chief Complaint Chief Complaint  Patient presents with  . Dizziness  . Otalgia    HPI Lawrence Werner is a 49 y.o. male.   Presents with bilateral ear pain x3 days.  He also reports dizziness and nausea.  He states he has previously been diagnosed with inflammation of the middle ear and treated with amoxicillin, tramadol, meclizine.  He denies fever, chills, rash, sore throat, cough, shortness of breath, vomiting, diarrhea, or other symptoms.  His medical history includes prediabetes, BPH, lumbar disc disease, GERD.  The history is provided by the patient. A language interpreter was used.    Past Medical History:  Diagnosis Date  . BPH (benign prostatic hyperplasia) 10/14/2016  . Candidiasis, cutaneous 05/30/2016  . Erectile dysfunction 09/20/2015  . Hematemesis with nausea 08/31/2015  . Lumbar disc disease with radiculopathy 09/20/2015  . Refugee health examination 09/20/2015  . Urinary frequency 06/17/2016    Patient Active Problem List   Diagnosis Date Noted  . Cutaneous skin tags 12/01/2017  . GERD (gastroesophageal reflux disease) 08/24/2017  . Prediabetes 05/30/2016    History reviewed. No pertinent surgical history.     Home Medications    Prior to Admission medications   Medication Sig Start Date End Date Taking? Authorizing Provider  gabapentin (NEURONTIN) 300 MG capsule Take 1 capsule (300 mg total) by mouth 3 (three) times daily. Take one pill today, two tomorrow then take three times daily 01/23/16   Narda Bonds, MD  lactobacillus acidophilus & bulgar (LACTINEX) chewable tablet Chew 1 tablet by mouth 3 (three) times daily with meals. 05/20/16   Ward, Chase Picket, PA-C  omeprazole (PRILOSEC) 40 MG capsule Take 1 capsule (40 mg total) by mouth daily. 11/01/20   Wallis Bamberg, PA-C  predniSONE (STERAPRED UNI-PAK 21 TAB) 10 MG (21) TBPK tablet Take by mouth  daily. As directed 11/30/20   Mickie Bail, NP  sildenafil (VIAGRA) 100 MG tablet Take 0.5-1 tablets (50-100 mg total) by mouth daily as needed for erectile dysfunction. 11/04/16   Erasmo Downer, MD  triamcinolone (KENALOG) 0.025 % ointment Apply 1 application topically 2 (two) times daily. 11/29/17   Mayo, Allyn Kenner, MD    Family History History reviewed. No pertinent family history.  Social History Social History   Tobacco Use  . Smoking status: Never Smoker  . Smokeless tobacco: Never Used  Substance Use Topics  . Alcohol use: No  . Drug use: No     Allergies   Patient has no known allergies.   Review of Systems Review of Systems  Constitutional: Negative for chills and fever.  HENT: Positive for ear pain. Negative for congestion and sore throat.   Eyes: Negative for pain and visual disturbance.  Respiratory: Negative for cough and shortness of breath.   Cardiovascular: Negative for chest pain and palpitations.  Gastrointestinal: Positive for nausea. Negative for abdominal pain, diarrhea and vomiting.  Genitourinary: Negative for dysuria and hematuria.  Musculoskeletal: Negative for arthralgias and back pain.  Skin: Negative for color change and rash.  Neurological: Positive for dizziness. Negative for tremors, seizures, syncope, facial asymmetry, speech difficulty, weakness, light-headedness, numbness and headaches.  All other systems reviewed and are negative.    Physical Exam Triage Vital Signs ED Triage Vitals  Enc Vitals Group     BP 11/30/20 1139 (!) 143/80     Pulse Rate 11/30/20  1139 79     Resp 11/30/20 1139 17     Temp 11/30/20 1139 98.3 F (36.8 C)     Temp Source 11/30/20 1139 Oral     SpO2 11/30/20 1139 99 %     Weight --      Height --      Head Circumference --      Peak Flow --      Pain Score 11/30/20 1135 5     Pain Loc --      Pain Edu? --      Excl. in GC? --    No data found.  Updated Vital Signs BP (!) 143/80 (BP Location:  Left Arm)   Pulse 79   Temp 98.3 F (36.8 C) (Oral)   Resp 17   SpO2 99%   Visual Acuity Right Eye Distance:   Left Eye Distance:   Bilateral Distance:    Right Eye Near:   Left Eye Near:    Bilateral Near:     Physical Exam Vitals and nursing note reviewed.  Constitutional:      General: He is not in acute distress.    Appearance: He is well-developed.  HENT:     Head: Normocephalic and atraumatic.     Right Ear: Tympanic membrane and ear canal normal.     Left Ear: Tympanic membrane and ear canal normal.     Nose: Nose normal.     Mouth/Throat:     Mouth: Mucous membranes are moist.     Pharynx: Oropharynx is clear.  Eyes:     Conjunctiva/sclera: Conjunctivae normal.  Cardiovascular:     Rate and Rhythm: Normal rate and regular rhythm.     Heart sounds: Normal heart sounds.  Pulmonary:     Effort: Pulmonary effort is normal. No respiratory distress.     Breath sounds: Normal breath sounds.  Abdominal:     Palpations: Abdomen is soft.     Tenderness: There is no abdominal tenderness. There is no guarding or rebound.  Musculoskeletal:     Cervical back: Neck supple.  Skin:    General: Skin is warm and dry.     Findings: No rash.  Neurological:     General: No focal deficit present.     Mental Status: He is alert.     Gait: Gait normal.  Psychiatric:        Mood and Affect: Mood normal.        Behavior: Behavior normal.      UC Treatments / Results  Labs (all labs ordered are listed, but only abnormal results are displayed) Labs Reviewed - No data to display  EKG   Radiology No results found.  Procedures Procedures (including critical care time)  Medications Ordered in UC Medications - No data to display  Initial Impression / Assessment and Plan / UC Course  I have reviewed the triage vital signs and the nursing notes.  Pertinent labs & imaging results that were available during my care of the patient were reviewed by me and considered in my  medical decision making (see chart for details).   Otalgia, dizziness.  Treating with prednisone taper.  Instructed him to take Tylenol or ibuprofen as needed for discomfort.  Instructed him to follow-up with his PCP if his symptoms are not improving.  Patient agrees to plan of care.   Final Clinical Impressions(s) / UC Diagnoses   Final diagnoses:  Otalgia of both ears  Dizziness  Discharge Instructions     Take the prednisone as directed.    Follow up with your primary care provider if your symptoms are not improving.       ED Prescriptions    Medication Sig Dispense Auth. Provider   predniSONE (STERAPRED UNI-PAK 21 TAB) 10 MG (21) TBPK tablet Take by mouth daily. As directed 21 tablet Mickie Bail, NP     PDMP not reviewed this encounter.   Mickie Bail, NP 11/30/20 [phone]

## 2021-01-09 ENCOUNTER — Ambulatory Visit (HOSPITAL_COMMUNITY)
Admission: EM | Admit: 2021-01-09 | Discharge: 2021-01-09 | Disposition: A | Discharge status: Home / Self Care | Payer: Medicaid Other | Attending: Family Medicine | Admitting: Family Medicine

## 2021-01-09 ENCOUNTER — Encounter (HOSPITAL_COMMUNITY): Payer: Self-pay | Admitting: Emergency Medicine

## 2021-01-09 ENCOUNTER — Other Ambulatory Visit: Payer: Self-pay

## 2021-01-09 DIAGNOSIS — Z20822 Contact with and (suspected) exposure to covid-19: Secondary | ICD-10-CM | POA: Diagnosis present

## 2021-01-09 DIAGNOSIS — R52 Pain, unspecified: Secondary | ICD-10-CM | POA: Diagnosis present

## 2021-01-09 DIAGNOSIS — R6889 Other general symptoms and signs: Secondary | ICD-10-CM

## 2021-01-09 DIAGNOSIS — U071 COVID-19: Secondary | ICD-10-CM | POA: Diagnosis not present

## 2021-01-09 MED ORDER — KETOROLAC TROMETHAMINE 60 MG/2ML IM SOLN
INTRAMUSCULAR | Status: AC
  Filled 2021-01-09: qty 2

## 2021-01-09 MED ORDER — IBUPROFEN 800 MG PO TABS: 800.0000 mg | ORAL_TABLET | Freq: Three times a day (TID) | ORAL | 0 refills | Status: DC

## 2021-01-09 MED ORDER — KETOROLAC TROMETHAMINE 60 MG/2ML IM SOLN
60.0000 mg | Freq: Once | INTRAMUSCULAR | Status: AC
  Administered 2021-01-09: 60 mg via INTRAMUSCULAR

## 2021-01-09 NOTE — ED Provider Notes (Signed)
[facility]    CSN: [id number] Arrival date & time: 01/09/21  1827      History   Chief Complaint Chief Complaint  Patient presents with  . Generalized Body Aches    HPI Lawrence Werner is a 50 y.o. male.   HPI   Body aches and headaches for 2 days.  Patient states that he feels like he would like a shot for pain.  He states is mostly in his legs points to his thighs and lower legs.  Some in his back.  Not much in his chest or upper back.  Denies sore throat nasal congestion.  Denies fever and chills.  Denies exposure to COVID.  Did not get COVID vaccinations. He is here with his 58 year old son who  efficiently is able to translate  Past Medical History:  Diagnosis Date  . BPH (benign prostatic hyperplasia) 10/14/2016  . Candidiasis, cutaneous 05/30/2016  . Erectile dysfunction 09/20/2015  . Hematemesis with nausea 08/31/2015  . Lumbar disc disease with radiculopathy 09/20/2015  . Refugee health examination 09/20/2015  . Urinary frequency 06/17/2016    Patient Active Problem List   Diagnosis Date Noted  . Cutaneous skin tags 12/01/2017  . GERD (gastroesophageal reflux disease) 08/24/2017  . Prediabetes 05/30/2016    History reviewed. No pertinent surgical history.     Home Medications    Prior to Admission medications   Medication Sig Start Date End Date Taking? Authorizing Provider  ibuprofen (ADVIL) 800 MG tablet Take 1 tablet (800 mg total) by mouth 3 (three) times daily. 01/09/21  Yes Eustace Moore, MD  gabapentin (NEURONTIN) 300 MG capsule Take 1 capsule (300 mg total) by mouth 3 (three) times daily. Take one pill today, two tomorrow then take three times daily 01/23/16   Narda Bonds, MD  lactobacillus acidophilus & bulgar (LACTINEX) chewable tablet Chew 1 tablet by mouth 3 (three) times daily with meals. 05/20/16   Ward, Chase Picket, PA-C  omeprazole (PRILOSEC) 40 MG capsule Take 1 capsule (40 mg total) by mouth daily. 11/01/20   Wallis Bamberg, PA-C  sildenafil (VIAGRA) 100 MG tablet Take 0.5-1 tablets (50-100 mg total) by mouth daily as needed for erectile dysfunction. 11/04/16   Erasmo Downer, MD    Family History History reviewed. No pertinent family history.  Social History Social History   Tobacco Use  . Smoking status: Never Smoker  . Smokeless tobacco: Never Used  Substance Use Topics  . Alcohol use: No  . Drug use: No     Allergies   Patient has no known allergies.   Review of Systems Review of Systems See HPI  Physical Exam Triage Vital Signs ED Triage Vitals  Enc Vitals Group     BP 01/09/21 1909 (!) 144/105     Pulse Rate 01/09/21 1909 89     Resp 01/09/21 1909 18     Temp 01/09/21 1909 99.5 F (37.5 C)     Temp Source 01/09/21 1909 Oral     SpO2 01/09/21 1909 97 %     Weight --      Height --      Head Circumference --      Peak Flow --      Pain Score 01/09/21 1907 5     Pain Loc --      Pain Edu? --      Excl. in GC? --    No data found.  Updated Vital Signs BP (!) 144/105 (BP Location:  Right Arm)   Pulse 89   Temp 99.5 F (37.5 C) (Oral)   Resp 18   SpO2 97%      Physical Exam Constitutional:      General: He is not in acute distress.    Appearance: He is well-developed and well-nourished.     Comments: Mask in place  HENT:     Head: Normocephalic and atraumatic.     Nose: No congestion.     Mouth/Throat:     Mouth: Oropharynx is clear and moist.     Pharynx: No posterior oropharyngeal erythema.  Eyes:     Conjunctiva/sclera: Conjunctivae normal.     Pupils: Pupils are equal, round, and reactive to light.  Cardiovascular:     Rate and Rhythm: Normal rate.  Pulmonary:     Effort: Pulmonary effort is normal. No respiratory distress.     Comments: Heart and lungs are normal Abdominal:     General: There is no distension.     Palpations: Abdomen is soft.  Musculoskeletal:        General: No edema. Normal range of motion.     Cervical back: Normal range  of motion.  Skin:    General: Skin is warm and dry.  Neurological:     Mental Status: He is alert.  Psychiatric:        Behavior: Behavior normal.      UC Treatments / Results  Labs (all labs ordered are listed, but only abnormal results are displayed) Labs Reviewed  SARS CORONAVIRUS 2 (TAT 6-24 HRS)    EKG   Radiology No results found.  Procedures Procedures (including critical care time)  Medications Ordered in UC Medications  ketorolac (TORADOL) injection 60 mg (has no administration in time range)    Initial Impression / Assessment and Plan / UC Course  I have reviewed the triage vital signs and the nursing notes.  Pertinent labs & imaging results that were available during my care of the patient were reviewed by me and considered in my medical decision making (see chart for details).     Toradol for pain Ibuprofen prescription for ongoing pain COVID testing was performed Conservative management recommended Final Clinical Impressions(s) / UC Diagnoses   Final diagnoses:  Flu-like symptoms  Body aches  Encounter for laboratory testing for COVID-19 virus     Discharge Instructions     Check for COVID result on MyChart Make sure to drink plenty of water Take ibuprofen 3 times a day with food.  This is for body aches You should quarantine at home until your test result is available If COVID is positive you need to isolate at home for 5 days     ED Prescriptions    Medication Sig Dispense Auth. Provider   ibuprofen (ADVIL) 800 MG tablet Take 1 tablet (800 mg total) by mouth 3 (three) times daily. 21 tablet Eustace Moore, MD     PDMP not reviewed this encounter.   Eustace Moore, MD 01/09/21 1945

## 2021-01-09 NOTE — Discharge Instructions (Signed)
Check for COVID result on MyChart Make sure to drink plenty of water Take ibuprofen 3 times a day with food.  This is for body aches You should quarantine at home until your test result is available If COVID is positive you need to isolate at home for 5 days

## 2021-01-09 NOTE — ED Triage Notes (Signed)
Pt presents with body aches xs 2 days.

## 2021-01-10 LAB — SARS CORONAVIRUS 2 (TAT 6-24 HRS): SARS Coronavirus 2: POSITIVE — AB

## 2021-01-14 ENCOUNTER — Encounter (HOSPITAL_COMMUNITY): Payer: Self-pay

## 2021-01-14 ENCOUNTER — Other Ambulatory Visit: Payer: Self-pay

## 2021-01-14 ENCOUNTER — Emergency Department (HOSPITAL_COMMUNITY)
Admission: EM | Admit: 2021-01-14 | Discharge: 2021-01-15 | Disposition: A | Discharge status: Home / Self Care | Payer: Medicaid Other | Attending: Emergency Medicine | Admitting: Emergency Medicine

## 2021-01-14 DIAGNOSIS — M7918 Myalgia, other site: Secondary | ICD-10-CM | POA: Diagnosis present

## 2021-01-14 DIAGNOSIS — Z5321 Procedure and treatment not carried out due to patient leaving prior to being seen by health care provider: Secondary | ICD-10-CM | POA: Diagnosis not present

## 2021-01-14 NOTE — ED Triage Notes (Signed)
Patient complains of ongoing body aches for several days. Has had negative test last week. Patient in no distress

## 2021-01-14 NOTE — ED Notes (Signed)
Patient left d/t wait time

## 2021-01-17 ENCOUNTER — Ambulatory Visit (INDEPENDENT_AMBULATORY_CARE_PROVIDER_SITE_OTHER): Payer: Medicaid Other

## 2021-01-17 ENCOUNTER — Encounter (HOSPITAL_COMMUNITY): Payer: Self-pay

## 2021-01-17 ENCOUNTER — Other Ambulatory Visit: Payer: Self-pay

## 2021-01-17 ENCOUNTER — Ambulatory Visit (HOSPITAL_COMMUNITY)
Admission: EM | Admit: 2021-01-17 | Discharge: 2021-01-17 | Disposition: A | Discharge status: Home / Self Care | Payer: Medicaid Other | Attending: Family Medicine | Admitting: Family Medicine

## 2021-01-17 DIAGNOSIS — U071 COVID-19: Secondary | ICD-10-CM | POA: Diagnosis not present

## 2021-01-17 DIAGNOSIS — R519 Headache, unspecified: Secondary | ICD-10-CM

## 2021-01-17 DIAGNOSIS — R0602 Shortness of breath: Secondary | ICD-10-CM

## 2021-01-17 MED ORDER — ALBUTEROL SULFATE HFA 108 (90 BASE) MCG/ACT IN AERS
2.0000 | INHALATION_SPRAY | Freq: Once | RESPIRATORY_TRACT | Status: AC
  Administered 2021-01-17: 2 via RESPIRATORY_TRACT

## 2021-01-17 MED ORDER — ALBUTEROL SULFATE HFA 108 (90 BASE) MCG/ACT IN AERS
INHALATION_SPRAY | RESPIRATORY_TRACT | Status: AC
  Filled 2021-01-17: qty 6.7

## 2021-01-17 MED ORDER — CYCLOBENZAPRINE HCL 10 MG PO TABS: 10.0000 mg | ORAL_TABLET | Freq: Two times a day (BID) | ORAL | 0 refills | Status: DC | PRN

## 2021-01-17 MED ORDER — PREDNISONE 20 MG PO TABS: 20.0000 mg | ORAL_TABLET | Freq: Every day | ORAL | 0 refills | Status: AC

## 2021-01-17 NOTE — ED Triage Notes (Signed)
Pt presents with ongoing shortness of breath, headache, fatigue, and generalized body aches X 8 days; pt tested positive for covid on 1/13

## 2021-01-17 NOTE — ED Provider Notes (Signed)
[facility]    CSN: [id number] Arrival date & time: 01/17/21  1827      History   Chief Complaint Chief Complaint  Patient presents with  . Covid + (8 days) SOB, HA, Fatigue    HPI Lawrence Werner is a 50 y.o. male.   HPI  Patient with an 8-day history of COVID-19 presents today with ongoing shortness of breath, headache, fatigue and generalized body aches.  He is afebrile. He is concern for worsening respiratory status since developing COVID 19.  He has been evaluated here at [facility] and since presented to ER and [facility]. He is unvaccinated. Complains of worsening headache, fatigue. occasional cough ad SOB. He received a Toradol injection without improvement of headache. He had been taking ibuprofen without improvement and endorses chest tightness intermittently. He has not had any fever over the last 24 hours. No history of asthma or recurrent bronchitis. Past Medical History:  Diagnosis Date  . BPH (benign prostatic hyperplasia) 10/14/2016  . Candidiasis, cutaneous 05/30/2016  . Erectile dysfunction 09/20/2015  . Hematemesis with nausea 08/31/2015  . Lumbar disc disease with radiculopathy 09/20/2015  . Refugee health examination 09/20/2015  . Urinary frequency 06/17/2016    Patient Active Problem List   Diagnosis Date Noted  . Cutaneous skin tags 12/01/2017  . GERD (gastroesophageal reflux disease) 08/24/2017  . Prediabetes 05/30/2016    History reviewed. No pertinent surgical history.     Home Medications    Prior to Admission medications   Medication Sig Start Date End Date Taking? Authorizing Provider  gabapentin (NEURONTIN) 300 MG capsule Take 1 capsule (300 mg total) by mouth 3 (three) times daily. Take one pill today, two tomorrow then take three times daily 01/23/16   Narda Bonds, MD  ibuprofen (ADVIL) 800 MG tablet Take 1 tablet (800 mg total) by mouth 3 (three) times daily. 01/09/21   Eustace Moore, MD  lactobacillus acidophilus & bulgar  (LACTINEX) chewable tablet Chew 1 tablet by mouth 3 (three) times daily with meals. 05/20/16   Ward, Chase Picket, PA-C  omeprazole (PRILOSEC) 40 MG capsule Take 1 capsule (40 mg total) by mouth daily. 11/01/20   Wallis Bamberg, PA-C  sildenafil (VIAGRA) 100 MG tablet Take 0.5-1 tablets (50-100 mg total) by mouth daily as needed for erectile dysfunction. 11/04/16   Bacigalupo, Marzella Schlein, MD    Family History Family History  Family history unknown: Yes    Social History Social History   Tobacco Use  . Smoking status: Never Smoker  . Smokeless tobacco: Never Used  Substance Use Topics  . Alcohol use: No  . Drug use: No     Allergies   Patient has no known allergies.   Review of Systems Review of Systems Pertinent negatives listed in HPI Physical Exam Triage Vital Signs ED Triage Vitals  Enc Vitals Group     BP 01/17/21 1845 131/80     Pulse Rate 01/17/21 1845 87     Resp 01/17/21 1845 17     Temp 01/17/21 1845 98.9 F (37.2 C)     Temp Source 01/17/21 1845 Oral     SpO2 01/17/21 1845 96 %     Weight --      Height --      Head Circumference --      Peak Flow --      Pain Score 01/17/21 1843 7     Pain Loc --      Pain Edu? --  Excl. in GC? --    No data found.  Updated Vital Signs BP 131/80 (BP Location: Right Arm)   Pulse 87   Temp 98.9 F (37.2 C) (Oral)   Resp 17   SpO2 96%   Visual Acuity Right Eye Distance:   Left Eye Distance:   Bilateral Distance:    Right Eye Near:   Left Eye Near:    Bilateral Near:     Physical Exam Constitutional: Patient non-ill-appearing, obese, no distress HENT: Normocephalic, atraumatic, External right and left ear normal. Oropharynx is clear and moist.  Eyes: Conjunctivae and EOM are normal. PERRLA, no scleral icterus. Neck: Normal ROM. Neck supple. No JVD. No tracheal deviation. No thyromegaly. CVS: RRR, S1/S2 +, no murmurs, no gallops, no carotid bruit.  Pulmonary: Coarse lung bilateral upper bronchials, other  CTABL Neuro: Alert. Normal reflexes, muscle tone coordination. No cranial nerve deficit. Skin: Skin is warm and dry. No rash noted. Not diaphoretic. No erythema. No pallor. Psychiatric: Anxiousness present.Behavior, judgment, thought content normal.  UC Treatments / Results  Labs (all labs ordered are listed, but only abnormal results are displayed) Labs Reviewed - No data to display  EKG   Radiology DG Chest 2 View  Result Date: 01/17/2021 CLINICAL DATA:  Short of breath, COVID-19 diagnosed 01/09/2021 EXAM: CHEST - 2 VIEW COMPARISON:  11/03/2016 FINDINGS: Frontal and lateral views of the chest demonstrate an unremarkable cardiac silhouette. Diffuse interstitial scarring is unchanged. No acute airspace disease, effusion, or pneumothorax. No acute bony abnormalities. IMPRESSION: 1. No acute intrathoracic process. Electronically Signed   By: Sharlet Salina M.D.   On: 01/17/2021 19:28   Procedures Procedures (including critical care time)  Medications Ordered in UC Medications  albuterol (VENTOLIN HFA) 108 (90 Base) MCG/ACT inhaler 2 puff (2 puffs Inhalation Given 01/17/21 2002)    Initial Impression / Assessment and Plan / UC Course  I have reviewed the triage vital signs and the nursing notes.  Pertinent labs & imaging results that were available during my care of the patient were reviewed by me and considered in my medical decision making (see chart for details).     COVID-19 virus with symptoms of SOB with normal oxygen level and HA. Treatment plan: Prednisone 20 mg daily x 5 days and Cyclobenzaprine for HA . Albuterol inhaler 2 puffs every 4-6 hours as needed. Final Clinical Impressions(s) / UC Diagnoses   Final diagnoses:  COVID-19 virus infection  SOB (shortness of breath)  Intractable headache, unspecified chronicity pattern, unspecified headache type   Discharge Instructions   None    ED Prescriptions    Medication Sig Dispense Auth. Provider   predniSONE  (DELTASONE) 20 MG tablet Take 1 tablet (20 mg total) by mouth daily with breakfast for 5 days. 5 tablet [name], FNP   cyclobenzaprine (FLEXERIL) 10 MG tablet Take 1 tablet (10 mg total) by mouth 2 (two) times daily as needed for muscle spasms (Medication may cause severe drowsiness avoid driving). 20 tablet [name], FNP     PDMP not reviewed this encounter.   [name], [place] 01/21/21 [phone]

## 2021-01-29 ENCOUNTER — Encounter (HOSPITAL_COMMUNITY): Payer: Self-pay

## 2021-01-29 ENCOUNTER — Ambulatory Visit (HOSPITAL_COMMUNITY)
Admission: EM | Admit: 2021-01-29 | Discharge: 2021-01-29 | Disposition: A | Discharge status: Home / Self Care | Payer: Medicaid Other | Attending: Family Medicine | Admitting: Family Medicine

## 2021-01-29 DIAGNOSIS — M62830 Muscle spasm of back: Secondary | ICD-10-CM | POA: Diagnosis not present

## 2021-01-29 MED ORDER — CYCLOBENZAPRINE HCL 10 MG PO TABS: 10.0000 mg | ORAL_TABLET | Freq: Three times a day (TID) | ORAL | 0 refills | Status: AC | PRN

## 2021-01-29 MED ORDER — NAPROXEN 500 MG PO TABS: 500.0000 mg | ORAL_TABLET | Freq: Two times a day (BID) | ORAL | 0 refills | Status: AC | PRN

## 2021-01-29 NOTE — ED Triage Notes (Signed)
Pt c/o back pain and muscle spasms X 4 days. Pt states the pain wakes him up at night.

## 2021-02-01 NOTE — ED Provider Notes (Signed)
[facility]    CSN: [id number] Arrival date & time: 01/29/21  1821      History   Chief Complaint Chief Complaint  Patient presents with  . Back Pain    HPI Lawrence Werner is a 50 y.o. male.   Arabic interpreter used today through medical interpreter service to tranlate this visit with patient's consent. Presenting today with 4 day history of left sided low to mid back pain with spasms that wake him up at night when he turns or lays certain ways. Denies known injury, hematuria, dysuria, fever, chills, N/V. Has not tried anything OTC at this time for sxs. Has had this issue in the past and notes it was muscle spasms.      Past Medical History:  Diagnosis Date  . BPH (benign prostatic hyperplasia) 10/14/2016  . Candidiasis, cutaneous 05/30/2016  . Erectile dysfunction 09/20/2015  . Hematemesis with nausea 08/31/2015  . Lumbar disc disease with radiculopathy 09/20/2015  . Refugee health examination 09/20/2015  . Urinary frequency 06/17/2016    Patient Active Problem List   Diagnosis Date Noted  . Cutaneous skin tags 12/01/2017  . GERD (gastroesophageal reflux disease) 08/24/2017  . Prediabetes 05/30/2016    History reviewed. No pertinent surgical history.     Home Medications    Prior to Admission medications   Medication Sig Start Date End Date Taking? Authorizing Provider  naproxen (NAPROSYN) 500 MG tablet Take 1 tablet (500 mg total) by mouth 2 (two) times daily as needed. 01/29/21  Yes Particia Nearing, PA-C  cyclobenzaprine (FLEXERIL) 10 MG tablet Take 1 tablet (10 mg total) by mouth 3 (three) times daily as needed for muscle spasms (Medication may cause severe drowsiness avoid driving). 01/29/21   Particia Nearing, PA-C  gabapentin (NEURONTIN) 300 MG capsule Take 1 capsule (300 mg total) by mouth 3 (three) times daily. Take one pill today, two tomorrow then take three times daily 01/23/16   Narda Bonds, MD  ibuprofen (ADVIL) 800 MG  tablet Take 1 tablet (800 mg total) by mouth 3 (three) times daily. 01/09/21   Eustace Moore, MD  lactobacillus acidophilus & bulgar (LACTINEX) chewable tablet Chew 1 tablet by mouth 3 (three) times daily with meals. 05/20/16   Ward, Chase Picket, PA-C  omeprazole (PRILOSEC) 40 MG capsule Take 1 capsule (40 mg total) by mouth daily. 11/01/20   Wallis Bamberg, PA-C  sildenafil (VIAGRA) 100 MG tablet Take 0.5-1 tablets (50-100 mg total) by mouth daily as needed for erectile dysfunction. 11/04/16   Bacigalupo, Marzella Schlein, MD    Family History Family History  Family history unknown: Yes    Social History Social History   Tobacco Use  . Smoking status: Never Smoker  . Smokeless tobacco: Never Used  Substance Use Topics  . Alcohol use: No  . Drug use: No     Allergies   Patient has no known allergies.   Review of Systems Review of Systems PER HPI    Physical Exam Triage Vital Signs ED Triage Vitals  Enc Vitals Group     BP 01/29/21 1950 112/65     Pulse Rate 01/29/21 1950 77     Resp 01/29/21 1950 16     Temp 01/29/21 1950 98.8 F (37.1 C)     Temp Source 01/29/21 1950 Oral     SpO2 01/29/21 1950 98 %     Weight --      Height --      Head Circumference --  Peak Flow --      Pain Score 01/29/21 1949 6     Pain Loc --      Pain Edu? --      Excl. in GC? --    No data found.  Updated Vital Signs BP 112/65 (BP Location: Right Arm)   Pulse 77   Temp 98.8 F (37.1 C) (Oral)   Resp 16   SpO2 98%   Visual Acuity Right Eye Distance:   Left Eye Distance:   Bilateral Distance:    Right Eye Near:   Left Eye Near:    Bilateral Near:     Physical Exam Vitals and nursing note reviewed.  Constitutional:      Appearance: Normal appearance. He is not ill-appearing.  HENT:     Head: Atraumatic.  Eyes:     Extraocular Movements: Extraocular movements intact.     Conjunctiva/sclera: Conjunctivae normal.  Cardiovascular:     Rate and Rhythm: Normal rate and  regular rhythm.  Pulmonary:     Effort: Pulmonary effort is normal.     Breath sounds: Normal breath sounds.  Abdominal:     General: Bowel sounds are normal. There is no distension.     Palpations: Abdomen is soft.     Tenderness: There is no abdominal tenderness. There is no guarding.  Musculoskeletal:        General: Tenderness (tender to deep palpation left flank, lumbar paraspinal muscles) present. No swelling or deformity. Normal range of motion.     Cervical back: Normal range of motion and neck supple.     Comments: No midline spinal ttp - SLR b/l  Skin:    General: Skin is warm and dry.     Findings: No erythema.  Neurological:     General: No focal deficit present.     Mental Status: He is alert and oriented to person, place, and time.  Psychiatric:        Mood and Affect: Mood normal.        Thought Content: Thought content normal.        Judgment: Judgment normal.      UC Treatments / Results  Labs (all labs ordered are listed, but only abnormal results are displayed) Labs Reviewed - No data to display  EKG   Radiology No results found.  Procedures Procedures (including critical care time)  Medications Ordered in UC Medications - No data to display  Initial Impression / Assessment and Plan / UC Course  I have reviewed the triage vital signs and the nursing notes.  Pertinent labs & imaging results that were available during my care of the patient were reviewed by me and considered in my medical decision making (see chart for details).     He declines U/A today as he declines urinary sxs, also not interested in x-ray imaging which is reasonable given lateral location of pain. Will treat with flexeril, anti inflammatory pain medication, heat rest, massage. Return for acutely worsening sxs.   Final Clinical Impressions(s) / UC Diagnoses   Final diagnoses:  Muscle spasm of back   Discharge Instructions   None    ED Prescriptions    Medication Sig  Dispense Auth. Provider   cyclobenzaprine (FLEXERIL) 10 MG tablet Take 1 tablet (10 mg total) by mouth 3 (three) times daily as needed for muscle spasms (Medication may cause severe drowsiness avoid driving). 20 tablet Particia Nearing, [place]   naproxen (NAPROSYN) 500 MG tablet Take 1 tablet (500 mg  total) by mouth 2 (two) times daily as needed. 30 tablet Particia Nearing, [place]     PDMP not reviewed this encounter.   Particia Nearing, [place] 02/01/21 1102

## 2021-05-02 ENCOUNTER — Encounter (HOSPITAL_COMMUNITY): Payer: Self-pay | Admitting: Emergency Medicine

## 2021-05-02 ENCOUNTER — Ambulatory Visit (HOSPITAL_COMMUNITY)
Admission: EM | Admit: 2021-05-02 | Discharge: 2021-05-02 | Disposition: A | Discharge status: Home / Self Care | Payer: Medicaid Other | Attending: Medical Oncology | Admitting: Medical Oncology

## 2021-05-02 ENCOUNTER — Other Ambulatory Visit: Payer: Self-pay

## 2021-05-02 DIAGNOSIS — R42 Dizziness and giddiness: Secondary | ICD-10-CM | POA: Diagnosis not present

## 2021-05-02 MED ORDER — PREDNISONE 10 MG (21) PO TBPK: ORAL_TABLET | Freq: Every day | ORAL | 0 refills | Status: DC

## 2021-05-02 NOTE — ED Provider Notes (Signed)
[facility]    CSN: [id number] Arrival date & time: 05/02/21  1540      History   Chief Complaint Chief Complaint  Patient presents with  . Dizziness    HPI Lawrence Werner is a 50 y.o. male.   HPI patient is accompanied by his son and [employer] virtual interpreter  Dizziness: Patient states that he has a history of vertigo that he gets about once a year during seasonal changes.  In the past he has been given antibiotics and meclizine which typically help his symptoms.  He states that the meclizine does tend to make him groggy and so he would like something that does not cause any grogginess if possible.  Recent symptoms have been occurring for the past week.  No head injury, chest pain, shortness of breath, cold symptoms.  He states that when he turns his head to the right he gets some room spinning sensation.  Slowly improved.  He reports that he has had full ENT work-up in the past with imaging without any significant abnormalities.  Past Medical History:  Diagnosis Date  . BPH (benign prostatic hyperplasia) 10/14/2016  . Candidiasis, cutaneous 05/30/2016  . Erectile dysfunction 09/20/2015  . Hematemesis with nausea 08/31/2015  . Lumbar disc disease with radiculopathy 09/20/2015  . Refugee health examination 09/20/2015  . Urinary frequency 06/17/2016    Patient Active Problem List   Diagnosis Date Noted  . Cutaneous skin tags 12/01/2017  . GERD (gastroesophageal reflux disease) 08/24/2017  . Prediabetes 05/30/2016    History reviewed. No pertinent surgical history.     Home Medications    Prior to Admission medications   Medication Sig Start Date End Date Taking? Authorizing Provider  predniSONE (STERAPRED UNI-PAK 21 TAB) 10 MG (21) TBPK tablet Take by mouth daily. Take 6 tabs by mouth daily  for 2 days, then 5 tabs for 2 days, then 4 tabs for 2 days, then 3 tabs for 2 days, 2 tabs for 2 days, then 1 tab by mouth daily for 2 days 05/02/21  Yes Riyanshi Wahab  M, PA-C  cyclobenzaprine (FLEXERIL) 10 MG tablet Take 1 tablet (10 mg total) by mouth 3 (three) times daily as needed for muscle spasms (Medication may cause severe drowsiness avoid driving). 01/29/21   Particia Nearing, PA-C  gabapentin (NEURONTIN) 300 MG capsule Take 1 capsule (300 mg total) by mouth 3 (three) times daily. Take one pill today, two tomorrow then take three times daily 01/23/16   Narda Bonds, MD  ibuprofen (ADVIL) 800 MG tablet Take 1 tablet (800 mg total) by mouth 3 (three) times daily. 01/09/21   Eustace Moore, MD  lactobacillus acidophilus & bulgar (LACTINEX) chewable tablet Chew 1 tablet by mouth 3 (three) times daily with meals. 05/20/16   Ward, Chase Picket, PA-C  naproxen (NAPROSYN) 500 MG tablet Take 1 tablet (500 mg total) by mouth 2 (two) times daily as needed. 01/29/21   Particia Nearing, PA-C  omeprazole (PRILOSEC) 40 MG capsule Take 1 capsule (40 mg total) by mouth daily. 11/01/20   Wallis Bamberg, PA-C  sildenafil (VIAGRA) 100 MG tablet Take 0.5-1 tablets (50-100 mg total) by mouth daily as needed for erectile dysfunction. 11/04/16   Bacigalupo, Marzella Schlein, MD    Family History Family History  Family history unknown: Yes    Social History Social History   Tobacco Use  . Smoking status: Never Smoker  . Smokeless tobacco: Never Used  Substance Use Topics  . Alcohol  use: No  . Drug use: No     Allergies   Patient has no known allergies.   Review of Systems Review of Systems  As stated above in HPI Physical Exam Triage Vital Signs ED Triage Vitals  Enc Vitals Group     BP 05/02/21 1653 110/68     Pulse Rate 05/02/21 1653 66     Resp 05/02/21 1653 17     Temp 05/02/21 1653 97.9 F (36.6 C)     Temp Source 05/02/21 1653 Oral     SpO2 05/02/21 1653 98 %     Weight --      Height --      Head Circumference --      Peak Flow --      Pain Score 05/02/21 1649 0     Pain Loc --      Pain Edu? --      Excl. in GC? --    No data  found.  Updated Vital Signs BP 110/68 (BP Location: Left Arm)   Pulse 66   Temp 97.9 F (36.6 C) (Oral)   Resp 17   SpO2 98%   Physical Exam Vitals and nursing note reviewed.  Constitutional:      General: He is not in acute distress.    Appearance: Normal appearance. He is not ill-appearing, toxic-appearing or diaphoretic.  HENT:     Head: Normocephalic and atraumatic.     Right Ear: Tympanic membrane, ear canal and external ear normal.     Left Ear: Tympanic membrane, ear canal and external ear normal.     Nose: Nose normal.     Mouth/Throat:     Mouth: Mucous membranes are moist.  Eyes:     Extraocular Movements: Extraocular movements intact.     Pupils: Pupils are equal, round, and reactive to light.     Comments: Mild right sided nystagmus to EOM exercises  Neck:     Vascular: No carotid bruit.  Cardiovascular:     Rate and Rhythm: Normal rate and regular rhythm.     Pulses: Normal pulses.     Heart sounds: Normal heart sounds.  Pulmonary:     Effort: Pulmonary effort is normal.     Breath sounds: Normal breath sounds.  Musculoskeletal:     Cervical back: Normal range of motion and neck supple. No rigidity or tenderness.  Lymphadenopathy:     Cervical: No cervical adenopathy.  Skin:    General: Skin is warm.     Capillary Refill: Capillary refill takes less than 2 seconds.     Coloration: Skin is not jaundiced or pale.     Findings: No rash.  Neurological:     General: No focal deficit present.     Mental Status: He is alert and oriented to person, place, and time. Mental status is at baseline.     Cranial Nerves: No cranial nerve deficit.     Sensory: No sensory deficit.     Motor: No weakness.     Coordination: Coordination normal.     Gait: Gait normal.     Deep Tendon Reflexes: Reflexes normal.  Psychiatric:        Mood and Affect: Mood normal.        Behavior: Behavior normal.      UC Treatments / Results  Labs (all labs ordered are listed, but  only abnormal results are displayed) Labs Reviewed - No data to display  EKG   Radiology No results  found.  Procedures Procedures (including critical care time)  Medications Ordered in UC Medications - No data to display  Initial Impression / Assessment and Plan / UC Course  I have reviewed the triage vital signs and the nursing notes.  Pertinent labs & imaging results that were available during my care of the patient were reviewed by me and considered in my medical decision making (see chart for details).     New.  Discussed benign positional vertigo including how this occurs and treatment options.  He has elected to trial prednisone.  Discussed how to take this medication along with common potential side effects and precautions.  We also discussed red flag signs and symptoms. Final Clinical Impressions(s) / UC Diagnoses   Final diagnoses:  Vertigo   Discharge Instructions   None    ED Prescriptions    Medication Sig Dispense Auth. Provider   predniSONE (STERAPRED UNI-PAK 21 TAB) 10 MG (21) TBPK tablet Take by mouth daily. Take 6 tabs by mouth daily  for 2 days, then 5 tabs for 2 days, then 4 tabs for 2 days, then 3 tabs for 2 days, 2 tabs for 2 days, then 1 tab by mouth daily for 2 days 42 tablet [place] Haislip, Brand Males, [place]     PDMP not reviewed this encounter.   Rushie Chestnut, [place] 05/02/21 1747

## 2021-05-02 NOTE — ED Triage Notes (Signed)
Pt presents with dizziness xs 1 week. Has been taking old RX of amoxicillin and meclizine. States has hx of ear infections

## 2021-12-15 ENCOUNTER — Other Ambulatory Visit: Payer: Self-pay

## 2021-12-15 ENCOUNTER — Ambulatory Visit (HOSPITAL_COMMUNITY)
Admission: EM | Admit: 2021-12-15 | Discharge: 2021-12-15 | Disposition: A | Discharge status: Home / Self Care | Payer: Medicaid Other | Attending: Urgent Care | Admitting: Urgent Care

## 2021-12-15 ENCOUNTER — Encounter (HOSPITAL_COMMUNITY): Payer: Self-pay

## 2021-12-15 DIAGNOSIS — S86811A Strain of other muscle(s) and tendon(s) at lower leg level, right leg, initial encounter: Secondary | ICD-10-CM | POA: Diagnosis not present

## 2021-12-15 DIAGNOSIS — R234 Changes in skin texture: Secondary | ICD-10-CM

## 2021-12-15 DIAGNOSIS — F515 Nightmare disorder: Secondary | ICD-10-CM | POA: Diagnosis not present

## 2021-12-15 MED ORDER — NAPROXEN 500 MG PO TABS: 500.0000 mg | ORAL_TABLET | Freq: Two times a day (BID) | ORAL | 0 refills | Status: AC

## 2021-12-15 MED ORDER — TRIAMCINOLONE ACETONIDE 0.1 % EX CREA: 1.0000 "application " | TOPICAL_CREAM | Freq: Two times a day (BID) | CUTANEOUS | 0 refills | Status: AC

## 2021-12-15 MED ORDER — DICLOFENAC SODIUM 1 % EX GEL: 1.0000 "application " | Freq: Four times a day (QID) | CUTANEOUS | 0 refills | Status: AC

## 2021-12-15 NOTE — Discharge Instructions (Addendum)
Monitor for improvement in symptoms for your Right calf.  You may need to start taking an oral pill if symptoms continue. Please take the pill with food. If any swelling of your foot or ankle, please head to the Emergency Room Use the cream on your hands once daily x 7 days. Do not use longer than 14 days. If symptoms persist, may purchase "[employer] working hands" cream. A referral has been placed for your memory concerns. Please call their office to set up an office visit.

## 2021-12-15 NOTE — ED Triage Notes (Signed)
Pt presents for left knee pain that is affecting his gait x 1 year. He also would like to discuss some abnormal memory loss x 1 year and would like to be referred to a specialist.

## 2021-12-17 NOTE — ED Provider Notes (Signed)
[facility]    CSN: [id number] Arrival date & time: 12/15/21  1006      History   Chief Complaint Chief Complaint  Patient presents with   Knee Pain    Knee pain x 1 month, he reports the pain is affecting his gait.  Memory loss x 1 year.    HPI Lawrence Werner is a 50 y.o. male.   Pleasant 50 year old male presents today with chief complaint of 1 month history of right knee pain.  He states it hurts primarily when kneeling down to pray, and while initiating standing from a kneeling position.  He reports the pain is primarily in his lateral knee, but does extend bilaterally into his calf.  He denies any swelling of his calf foot or ankle.  No warmth or erythema.  He denies any trauma to his knee but admits to overuse with constant bending stooping and kneeling daily.  He has not tried any over-the-counter PO medications but admits to trying a topical cream that "smelled really bad".  He admits that the cream indeed helped, but does not want to continue to use it due to the smell.  He denies any joint effusion, decreased range of motion, or any additional musculoskeletal symptoms. Patient also is concerned regarding his "memory" for the past year.  He states he notices it most when his wife asks him to go to the grocery store, he will come home and not remember why he went there or what he went for.  He denies any issues with remembering names, dates, or past events.  Denies family history of dementia.  He admits he feels his symptoms are due to a preoccupation regarding a lot of conflict, war, and hard times he has experienced this past year.  He admits to poor sleep and nightmares regarding them.  He will sometimes wake up with a night terror.  He denies thoughts of self-harm or harming others.  He denies any audiovisual hallucinations.  He has not tried any medications for this and has not seen any specialist.  He takes no daily medications and has no known chronic  medical conditions. Lastly, patient is concerned about dry and cracked hands.   Knee Pain Associated symptoms: no back pain    Past Medical History:  Diagnosis Date   BPH (benign prostatic hyperplasia) 10/14/2016   Candidiasis, cutaneous 05/30/2016   Erectile dysfunction 09/20/2015   Hematemesis with nausea 08/31/2015   Lumbar disc disease with radiculopathy 09/20/2015   Refugee health examination 09/20/2015   Urinary frequency 06/17/2016    Patient Active Problem List   Diagnosis Date Noted   Cutaneous skin tags 12/01/2017   GERD (gastroesophageal reflux disease) 08/24/2017   Prediabetes 05/30/2016    History reviewed. No pertinent surgical history.     Home Medications    Prior to Admission medications   Medication Sig Start Date End Date Taking? Authorizing Provider  diclofenac Sodium (VOLTAREN) 1 % GEL Apply 1 application topically 4 (four) times daily. 12/15/21  Yes Yeraldi Fidler L, PA  naproxen (NAPROSYN) 500 MG tablet Take 1 tablet (500 mg total) by mouth 2 (two) times daily with a meal for 14 days. 12/15/21 12/29/21 Yes Thadius Smisek L, PA  triamcinolone cream (KENALOG) 0.1 % Apply 1 application topically 2 (two) times daily. Apply one application to bilateral hands once daily x 7 days. Do not exceed 14 days of treatment. 12/15/21  Yes Lynora Dymond L, PA  cyclobenzaprine (FLEXERIL) 10 MG tablet Take 1  tablet (10 mg total) by mouth 3 (three) times daily as needed for muscle spasms (Medication may cause severe drowsiness avoid driving). 01/29/21   Particia Nearing, PA-C  gabapentin (NEURONTIN) 300 MG capsule Take 1 capsule (300 mg total) by mouth 3 (three) times daily. Take one pill today, two tomorrow then take three times daily 01/23/16   Narda Bonds, MD  lactobacillus acidophilus & bulgar (LACTINEX) chewable tablet Chew 1 tablet by mouth 3 (three) times daily with meals. 05/20/16   Ward, Chase Picket, PA-C  naproxen (NAPROSYN) 500 MG tablet Take 1 tablet (500 mg  total) by mouth 2 (two) times daily as needed. 01/29/21   Particia Nearing, PA-C  omeprazole (PRILOSEC) 40 MG capsule Take 1 capsule (40 mg total) by mouth daily. 11/01/20   Wallis Bamberg, PA-C  sildenafil (VIAGRA) 100 MG tablet Take 0.5-1 tablets (50-100 mg total) by mouth daily as needed for erectile dysfunction. 11/04/16   Bacigalupo, Marzella Schlein, MD    Family History Family History  Family history unknown: Yes    Social History Social History   Tobacco Use   Smoking status: Never   Smokeless tobacco: Never  Substance Use Topics   Alcohol use: No   Drug use: No     Allergies   Patient has no known allergies.   Review of Systems Review of Systems  Musculoskeletal:  Positive for arthralgias (R knee). Negative for back pain and gait problem.  Psychiatric/Behavioral:  Positive for decreased concentration (forgetfulness) and sleep disturbance. Negative for agitation, behavioral problems, dysphoric mood, hallucinations, self-injury and suicidal ideas. The patient is nervous/anxious.   All other systems reviewed and are negative.   Physical Exam Triage Vital Signs ED Triage Vitals [12/15/21 1133]  Enc Vitals Group     BP (!) 144/90     Pulse Rate 87     Resp      Temp 97.7 F (36.5 C)     Temp Source Oral     SpO2 100 %     Weight      Height      Head Circumference      Peak Flow      Pain Score 10     Pain Loc      Pain Edu?      Excl. in [place]?    No data found.  Updated Vital Signs BP (!) 144/90 (BP Location: Right Arm)    Pulse 87    Temp 97.7 F (36.5 C) (Oral)    SpO2 100%   Visual Acuity Right Eye Distance:   Left Eye Distance:   Bilateral Distance:    Right Eye Near:   Left Eye Near:    Bilateral Near:     Physical Exam Vitals and nursing note reviewed. Exam conducted with a chaperone present.  Constitutional:      General: He is not in acute distress.    Appearance: He is well-developed. He is not ill-appearing, toxic-appearing or diaphoretic.   HENT:     Head: Normocephalic and atraumatic.     Nose: Nose normal.     Mouth/Throat:     Mouth: Mucous membranes are moist.     Pharynx: Oropharynx is clear. No oropharyngeal exudate or posterior oropharyngeal erythema.  Eyes:     Conjunctiva/sclera: Conjunctivae normal.  Cardiovascular:     Rate and Rhythm: Normal rate and regular rhythm.     Heart sounds: No murmur heard. Pulmonary:     Effort: Pulmonary effort is normal.  No respiratory distress.     Breath sounds: Normal breath sounds.  Abdominal:     Palpations: Abdomen is soft.     Tenderness: There is no abdominal tenderness.  Musculoskeletal:        General: Tenderness (to deep palpation of origin of gastrocnemius muscle both medially and laterally;) present. No swelling, deformity or signs of injury. Normal range of motion.     Cervical back: Normal range of motion and neck supple. No tenderness.     Right lower leg: No edema.     Left lower leg: No edema.     Comments: FROM knee.  Stable exam - negative varus and valgus stress.  Negative anterior and posterior drawer.  Negative Lachman, McMurray, and Apley maneuver. No effusion noted.   Lymphadenopathy:     Cervical: No cervical adenopathy.  Skin:    General: Skin is warm and dry.     Capillary Refill: Capillary refill takes less than 2 seconds.     Comments: Dry, scaling, cracked hands bilaterally  Neurological:     General: No focal deficit present.     Mental Status: He is alert and oriented to person, place, and time. Mental status is at baseline.     Cranial Nerves: No cranial nerve deficit.     Motor: No weakness.     Coordination: Coordination normal.     Gait: Gait normal.  Psychiatric:        Mood and Affect: Mood normal.        Behavior: Behavior normal.        Thought Content: Thought content normal.        Judgment: Judgment normal.     UC Treatments / Results  Labs (all labs ordered are listed, but only abnormal results are displayed) Labs  Reviewed - No data to display  EKG   Radiology No results found.  Procedures Procedures (including critical care time)  Medications Ordered in UC Medications - No data to display  Initial Impression / Assessment and Plan / UC Course  I have reviewed the triage vital signs and the nursing notes.  Pertinent labs & imaging results that were available during my care of the patient were reviewed by me and considered in my medical decision making (see chart for details).     Nightmares -we will refer to her neuropsych for evaluation of memory issues and suspected developing PTSD. Strain of calf muscle on right -exam benign.  No clinical signs to warrant DVT work-up.  Topical menthol cream resolved his pain and therefore favors a muscular cause.  Recommended patient continue with the cream for his knee, but if symptoms worsen may try the oral pill. Cracking of skin -7 days worth of triamcinolone, try O'Keefe's working hands.  Also recommend using over-the-counter emollient cream like Vaseline or Aquaphor  Final Clinical Impressions(s) / UC Diagnoses   Final diagnoses:  Nightmares  Strain of calf muscle, right, initial encounter  Fissure in skin of both hands     Discharge Instructions      Monitor for improvement in symptoms for your Right calf.  You may need to start taking an oral pill if symptoms continue. Please take the pill with food. If any swelling of your foot or ankle, please head to the Emergency Room Use the cream on your hands once daily x 7 days. Do not use longer than 14 days. If symptoms persist, may purchase "O'Keeffes working hands" cream. A referral has been placed for your memory  concerns. Please call their office to set up an office visit.    ED Prescriptions     Medication Sig Dispense Auth. Provider   diclofenac Sodium (VOLTAREN) 1 % GEL Apply 1 application topically 4 (four) times daily. 100 g Ryu Cerreta L, PA   naproxen (NAPROSYN) 500 MG tablet  Take 1 tablet (500 mg total) by mouth 2 (two) times daily with a meal for 14 days. 28 tablet Keiron Iodice L, PA   triamcinolone cream (KENALOG) 0.1 % Apply 1 application topically 2 (two) times daily. Apply one application to bilateral hands once daily x 7 days. Do not exceed 14 days of treatment. 30 g Braulio Kiedrowski L, [place]      PDMP not reviewed this encounter.   Maretta Bees, [place] 12/17/21 1607

## 2021-12-30 ENCOUNTER — Ambulatory Visit (HOSPITAL_COMMUNITY)
Admission: EM | Admit: 2021-12-30 | Discharge: 2021-12-30 | Disposition: A | Discharge status: Home / Self Care | Payer: Medicaid Other | Attending: Student | Admitting: Student

## 2021-12-30 ENCOUNTER — Encounter (HOSPITAL_COMMUNITY): Payer: Self-pay

## 2021-12-30 ENCOUNTER — Other Ambulatory Visit: Payer: Self-pay

## 2021-12-30 DIAGNOSIS — K219 Gastro-esophageal reflux disease without esophagitis: Secondary | ICD-10-CM

## 2021-12-30 DIAGNOSIS — Z789 Other specified health status: Secondary | ICD-10-CM

## 2021-12-30 MED ORDER — OMEPRAZOLE 40 MG PO CPDR: 40.0000 mg | DELAYED_RELEASE_CAPSULE | Freq: Every day | ORAL | 1 refills | Status: DC

## 2021-12-30 NOTE — ED Provider Notes (Signed)
[place]    CSN: [id number] Arrival date & time: 12/30/21  1837      History   Chief Complaint Chief Complaint  Patient presents with   Medication Refill    HPI Lawrence Werner is a 51 y.o. male presenting for refill on the Prilosec.  Has tolerated this well for years.  Denies current symptoms.  Denies abdominal pain, nausea, vomiting.  HPI  Past Medical History:  Diagnosis Date   BPH (benign prostatic hyperplasia) 10/14/2016   Candidiasis, cutaneous 05/30/2016   Erectile dysfunction 09/20/2015   Hematemesis with nausea 08/31/2015   Lumbar disc disease with radiculopathy 09/20/2015   Refugee health examination 09/20/2015   Urinary frequency 06/17/2016    Patient Active Problem List   Diagnosis Date Noted   Cutaneous skin tags 12/01/2017   GERD (gastroesophageal reflux disease) 08/24/2017   Prediabetes 05/30/2016    History reviewed. No pertinent surgical history.     Home Medications    Prior to Admission medications   Medication Sig Start Date End Date Taking? Authorizing Provider  cyclobenzaprine (FLEXERIL) 10 MG tablet Take 1 tablet (10 mg total) by mouth 3 (three) times daily as needed for muscle spasms (Medication may cause severe drowsiness avoid driving). 01/29/21   Volney American, PA-C  diclofenac Sodium (VOLTAREN) 1 % GEL Apply 1 application topically 4 (four) times daily. 12/15/21   Crain, Whitney L, PA  gabapentin (NEURONTIN) 300 MG capsule Take 1 capsule (300 mg total) by mouth 3 (three) times daily. Take one pill today, two tomorrow then take three times daily 01/23/16   Mariel Aloe, MD  lactobacillus acidophilus & bulgar (LACTINEX) chewable tablet Chew 1 tablet by mouth 3 (three) times daily with meals. 05/20/16   Ward, Ozella Almond, PA-C  naproxen (NAPROSYN) 500 MG tablet Take 1 tablet (500 mg total) by mouth 2 (two) times daily as needed. 01/29/21   Volney American, PA-C  omeprazole (PRILOSEC) 40 MG capsule Take 1  capsule (40 mg total) by mouth daily. 12/30/21   Hazel Sams, PA-C  sildenafil (VIAGRA) 100 MG tablet Take 0.5-1 tablets (50-100 mg total) by mouth daily as needed for erectile dysfunction. 11/04/16   Virginia Crews, MD  triamcinolone cream (KENALOG) 0.1 % Apply 1 application topically 2 (two) times daily. Apply one application to bilateral hands once daily x 7 days. Do not exceed 14 days of treatment. 12/15/21   Chaney Malling, PA    Family History Family History  Family history unknown: Yes    Social History Social History   Tobacco Use   Smoking status: Never   Smokeless tobacco: Never  Substance Use Topics   Alcohol use: No   Drug use: No     Allergies   Patient has no known allergies.   Review of Systems Review of Systems  Gastrointestinal:  Negative for abdominal pain.  All other systems reviewed and are negative.   Physical Exam Triage Vital Signs ED Triage Vitals  Enc Vitals Group     BP      Pulse      Resp      Temp      Temp src      SpO2      Weight      Height      Head Circumference      Peak Flow      Pain Score      Pain Loc      Pain Edu?  Excl. in [place]?    No data found.  Updated Vital Signs BP 132/86 (BP Location: Left Arm)    Pulse 75    Temp 98.2 F (36.8 C) (Oral)    Resp 18    SpO2 97%   Visual Acuity Right Eye Distance:   Left Eye Distance:   Bilateral Distance:    Right Eye Near:   Left Eye Near:    Bilateral Near:     Physical Exam Vitals reviewed.  Constitutional:      General: He is not in acute distress.    Appearance: Normal appearance. He is not ill-appearing.  HENT:     Head: Normocephalic and atraumatic.     Mouth/Throat:     Mouth: Mucous membranes are moist.     Comments: Moist mucous membranes Eyes:     Extraocular Movements: Extraocular movements intact.     Pupils: Pupils are equal, round, and reactive to light.  Cardiovascular:     Rate and Rhythm: Normal rate and regular rhythm.      Heart sounds: Normal heart sounds.  Pulmonary:     Effort: Pulmonary effort is normal.     Breath sounds: Normal breath sounds. No wheezing, rhonchi or rales.  Abdominal:     General: Bowel sounds are normal. There is no distension.     Palpations: Abdomen is soft. There is no mass.     Tenderness: There is no abdominal tenderness. There is no right CVA tenderness, left CVA tenderness, guarding or rebound.  Skin:    General: Skin is warm.     Capillary Refill: Capillary refill takes less than 2 seconds.     Comments: Good skin turgor  Neurological:     General: No focal deficit present.     Mental Status: He is alert and oriented to person, place, and time.  Psychiatric:        Mood and Affect: Mood normal.        Behavior: Behavior normal.     UC Treatments / Results  Labs (all labs ordered are listed, but only abnormal results are displayed) Labs Reviewed - No data to display  EKG   Radiology No results found.  Procedures Procedures (including critical care time)  Medications Ordered in UC Medications - No data to display  Initial Impression / Assessment and Plan / UC Course  I have reviewed the triage vital signs and the nursing notes.  Pertinent labs & imaging results that were available during my care of the patient were reviewed by me and considered in my medical decision making (see chart for details).     This patient is a very pleasant 51 y.o. year old male presenting for refill on his prilosec. Sent. No current symptoms. Spoke with this patient using translation provided by son.   Final Clinical Impressions(s) / UC Diagnoses   Final diagnoses:  Gastroesophageal reflux disease without esophagitis     Discharge Instructions      -Continue omeprazole. You can purchase this medication over-the-counter, but I also sent a prescription.     ED Prescriptions     Medication Sig Dispense Auth. Provider   omeprazole (PRILOSEC) 40 MG capsule  (Status:  Discontinued) Take 1 capsule (40 mg total) by mouth daily. 90 capsule Hazel Sams, PA-C   omeprazole (PRILOSEC) 40 MG capsule Take 1 capsule (40 mg total) by mouth daily. 90 capsule Hazel Sams, PA-C      PDMP not reviewed this encounter.   Phillip Heal,  Sherlon Handing, PA-C 12/30/21 2058

## 2021-12-30 NOTE — Discharge Instructions (Addendum)
-  Continue omeprazole. You can purchase this medication over-the-counter, but I also sent a prescription.

## 2021-12-30 NOTE — ED Triage Notes (Signed)
Pt presents for refill for omeprazole.

## 2022-01-10 ENCOUNTER — Ambulatory Visit (INDEPENDENT_AMBULATORY_CARE_PROVIDER_SITE_OTHER): Payer: Medicaid Other

## 2022-01-10 ENCOUNTER — Other Ambulatory Visit: Payer: Self-pay

## 2022-01-10 ENCOUNTER — Encounter (HOSPITAL_COMMUNITY): Payer: Self-pay

## 2022-01-10 ENCOUNTER — Ambulatory Visit (HOSPITAL_COMMUNITY)
Admission: EM | Admit: 2022-01-10 | Discharge: 2022-01-10 | Disposition: A | Discharge status: Home / Self Care | Payer: Medicaid Other | Attending: Physician Assistant | Admitting: Physician Assistant

## 2022-01-10 DIAGNOSIS — R059 Cough, unspecified: Secondary | ICD-10-CM

## 2022-01-10 DIAGNOSIS — R509 Fever, unspecified: Secondary | ICD-10-CM | POA: Diagnosis not present

## 2022-01-10 DIAGNOSIS — R0602 Shortness of breath: Secondary | ICD-10-CM

## 2022-01-10 DIAGNOSIS — J069 Acute upper respiratory infection, unspecified: Secondary | ICD-10-CM | POA: Diagnosis not present

## 2022-01-10 DIAGNOSIS — U071 COVID-19: Secondary | ICD-10-CM | POA: Diagnosis not present

## 2022-01-10 LAB — POC INFLUENZA A AND B ANTIGEN (URGENT CARE ONLY)
INFLUENZA A ANTIGEN, POC: NEGATIVE
INFLUENZA B ANTIGEN, POC: NEGATIVE

## 2022-01-10 LAB — CBG MONITORING, ED: Glucose-Capillary: 179 mg/dL — ABNORMAL HIGH (ref 70–99)

## 2022-01-10 LAB — SARS CORONAVIRUS 2 (TAT 6-24 HRS): SARS Coronavirus 2: POSITIVE — AB

## 2022-01-10 MED ORDER — ALBUTEROL SULFATE HFA 108 (90 BASE) MCG/ACT IN AERS
1.0000 | INHALATION_SPRAY | Freq: Four times a day (QID) | RESPIRATORY_TRACT | 0 refills | Status: AC | PRN
Start: 2022-01-10 — End: ?

## 2022-01-10 MED ORDER — PROMETHAZINE-DM 6.25-15 MG/5ML PO SYRP: 5.0000 mL | ORAL_SOLUTION | Freq: Four times a day (QID) | ORAL | 0 refills | Status: AC | PRN

## 2022-01-10 NOTE — ED Provider Notes (Signed)
[place]    CSN: [id number] Arrival date & time: 01/10/22  1133      History   Chief Complaint Chief Complaint  Patient presents with   Cough    HPI Lawrence Werner is a 51 y.o. male.   Pt complains of productive cough and intermittent wheezing that started 3-4 days ago.  He reports congestion and rhinorrhea.  Denies body aches, fever, n/v/d.  He has taken tylenol with no relief of sx.  Son has similar sx.    Past Medical History:  Diagnosis Date   BPH (benign prostatic hyperplasia) 10/14/2016   Candidiasis, cutaneous 05/30/2016   Erectile dysfunction 09/20/2015   Hematemesis with nausea 08/31/2015   Lumbar disc disease with radiculopathy 09/20/2015   Refugee health examination 09/20/2015   Urinary frequency 06/17/2016    Patient Active Problem List   Diagnosis Date Noted   Cutaneous skin tags 12/01/2017   GERD (gastroesophageal reflux disease) 08/24/2017   Prediabetes 05/30/2016    History reviewed. No pertinent surgical history.     Home Medications    Prior to Admission medications   Medication Sig Start Date End Date Taking? Authorizing Provider  albuterol (VENTOLIN HFA) 108 (90 Base) MCG/ACT inhaler Inhale 1-2 puffs into the lungs every 6 (six) hours as needed for wheezing or shortness of breath. 01/10/22  Yes Ward, Lenise Arena, PA-C  promethazine-dextromethorphan (PROMETHAZINE-DM) 6.25-15 MG/5ML syrup Take 5 mLs by mouth 4 (four) times daily as needed for cough. 01/10/22  Yes Ward, Lenise Arena, PA-C  cyclobenzaprine (FLEXERIL) 10 MG tablet Take 1 tablet (10 mg total) by mouth 3 (three) times daily as needed for muscle spasms (Medication may cause severe drowsiness avoid driving). 01/29/21   Volney American, PA-C  diclofenac Sodium (VOLTAREN) 1 % GEL Apply 1 application topically 4 (four) times daily. 12/15/21   Crain, Whitney L, PA  gabapentin (NEURONTIN) 300 MG capsule Take 1 capsule (300 mg total) by mouth 3 (three) times daily. Take one pill  today, two tomorrow then take three times daily 01/23/16   Mariel Aloe, MD  lactobacillus acidophilus & bulgar (LACTINEX) chewable tablet Chew 1 tablet by mouth 3 (three) times daily with meals. 05/20/16   Ward, Ozella Almond, PA-C  naproxen (NAPROSYN) 500 MG tablet Take 1 tablet (500 mg total) by mouth 2 (two) times daily as needed. 01/29/21   Volney American, PA-C  omeprazole (PRILOSEC) 40 MG capsule Take 1 capsule (40 mg total) by mouth daily. 12/30/21   Hazel Sams, PA-C  sildenafil (VIAGRA) 100 MG tablet Take 0.5-1 tablets (50-100 mg total) by mouth daily as needed for erectile dysfunction. 11/04/16   Virginia Crews, MD  triamcinolone cream (KENALOG) 0.1 % Apply 1 application topically 2 (two) times daily. Apply one application to bilateral hands once daily x 7 days. Do not exceed 14 days of treatment. 12/15/21   Chaney Malling, PA    Family History Family History  Family history unknown: Yes    Social History Social History   Tobacco Use   Smoking status: Never   Smokeless tobacco: Never  Substance Use Topics   Alcohol use: No   Drug use: No     Allergies   Patient has no known allergies.   Review of Systems Review of Systems  Constitutional:  Negative for chills and fever.  HENT:  Positive for congestion. Negative for ear pain and sore throat.   Eyes:  Negative for pain and visual disturbance.  Respiratory:  Positive  for cough and wheezing. Negative for shortness of breath.   Cardiovascular:  Negative for chest pain and palpitations.  Gastrointestinal:  Negative for abdominal pain and vomiting.  Genitourinary:  Negative for dysuria and hematuria.  Musculoskeletal:  Negative for arthralgias and back pain.  Skin:  Negative for color change and rash.  Neurological:  Negative for seizures and syncope.  All other systems reviewed and are negative.   Physical Exam Triage Vital Signs ED Triage Vitals [01/10/22 1307]  Enc Vitals Group     BP 138/87      Pulse Rate 83     Resp 18     Temp 98.6 F (37 C)     Temp Source Oral     SpO2 99 %     Weight      Height      Head Circumference      Peak Flow      Pain Score 0     Pain Loc      Pain Edu?      Excl. in [place]?    No data found.  Updated Vital Signs BP 138/87 (BP Location: Right Arm)    Pulse 83    Temp 98.6 F (37 C) (Oral)    Resp 18    SpO2 99%   Visual Acuity Right Eye Distance:   Left Eye Distance:   Bilateral Distance:    Right Eye Near:   Left Eye Near:    Bilateral Near:     Physical Exam Vitals and nursing note reviewed.  Constitutional:      General: He is not in acute distress.    Appearance: He is well-developed.  HENT:     Head: Normocephalic and atraumatic.  Eyes:     Conjunctiva/sclera: Conjunctivae normal.  Cardiovascular:     Rate and Rhythm: Normal rate and regular rhythm.     Heart sounds: No murmur heard. Pulmonary:     Effort: Pulmonary effort is normal. No respiratory distress.     Breath sounds: Normal breath sounds.  Abdominal:     Palpations: Abdomen is soft.     Tenderness: There is no abdominal tenderness.  Musculoskeletal:        General: No swelling.     Cervical back: Neck supple.  Skin:    General: Skin is warm and dry.     Capillary Refill: Capillary refill takes less than 2 seconds.  Neurological:     Mental Status: He is alert.  Psychiatric:        Mood and Affect: Mood normal.     UC Treatments / Results  Labs (all labs ordered are listed, but only abnormal results are displayed) Labs Reviewed  CBG MONITORING, ED - Abnormal; Notable for the following components:      Result Value   Glucose-Capillary 179 (*)    All other components within normal limits  SARS CORONAVIRUS 2 (TAT 6-24 HRS)  POC INFLUENZA A AND B ANTIGEN (URGENT CARE ONLY)    EKG   Radiology DG Chest 2 View  Result Date: 01/10/2022 CLINICAL DATA:  Cough, shortness of breath and fever for 4 days. EXAM: CHEST - 2 VIEW COMPARISON:  01/17/2021  FINDINGS: Normal heart, mediastinum and hila. Clear lungs.  No pleural effusion or pneumothorax. Skeletal structures are intact. IMPRESSION: No active cardiopulmonary disease. Electronically Signed   By: Lajean Manes M.D.   On: 01/10/2022 14:20    Procedures Procedures (including critical care time)  Medications Ordered in UC Medications -  No data to display  Initial Impression / Assessment and Plan / UC Course  I have reviewed the triage vital signs and the nursing notes.  Pertinent labs & imaging results that were available during my care of the patient were reviewed by me and considered in my medical decision making (see chart for details).     URI, COVID test pending. Overall well appearing in no acute distress, vitals wnl, stable for discharge. Inhaler and cough syrup prescribed.  Supportive treatment discussed.  Return precautions discussed.   Pt requests CBG today as it has not been checked in a while.  Advised pt to follow up with PCP for further evaluation.  Final Clinical Impressions(s) / UC Diagnoses   Final diagnoses:  Viral URI with cough     Discharge Instructions      Take cough syrup as needed for cough Use inhaler as needed for wheezing Recommend Mucinex and Flonase COVID test pending Drink plenty of fluids, rest Return if symptoms become worse   ED Prescriptions     Medication Sig Dispense Auth. Provider   promethazine-dextromethorphan (PROMETHAZINE-DM) 6.25-15 MG/5ML syrup Take 5 mLs by mouth 4 (four) times daily as needed for cough. 118 mL Ward, Janett Billow Z, PA-C   albuterol (VENTOLIN HFA) 108 (90 Base) MCG/ACT inhaler Inhale 1-2 puffs into the lungs every 6 (six) hours as needed for wheezing or shortness of breath. 1 each Ward, Lenise Arena, PA-C      PDMP not reviewed this encounter.   Ward, Lenise Arena, PA-C 01/26/22 1858

## 2022-01-10 NOTE — ED Triage Notes (Signed)
Arabic Interpreter Id [id number]  Pt states that he woke up 3-4 days ago c/o wheeing, shortness of breath, productive cough with whitish sputum, congested ears and runny nose. Pt has taken Tylenol without relief. Pt's son has the same sx.

## 2022-01-10 NOTE — Discharge Instructions (Addendum)
Take cough syrup as needed for cough Use inhaler as needed for wheezing Recommend Mucinex and Flonase COVID test pending Drink plenty of fluids, rest Return if symptoms become worse

## 2022-10-26 ENCOUNTER — Ambulatory Visit
Admission: EM | Admit: 2022-10-26 | Discharge: 2022-10-26 | Disposition: A | Discharge status: Home / Self Care | Payer: Medicaid Other

## 2022-10-26 DIAGNOSIS — E1165 Type 2 diabetes mellitus with hyperglycemia: Secondary | ICD-10-CM

## 2022-10-26 DIAGNOSIS — H8110 Benign paroxysmal vertigo, unspecified ear: Secondary | ICD-10-CM

## 2022-10-26 HISTORY — DX: Essential (primary) hypertension: I10

## 2022-10-26 HISTORY — DX: Type 2 diabetes mellitus without complications: E11.9

## 2022-10-26 LAB — POCT FASTING CBG KUC MANUAL ENTRY: POCT Glucose (KUC): 300 mg/dL — AB (ref 70–99)

## 2022-10-26 MED ORDER — PREDNISONE 10 MG (21) PO TBPK: ORAL_TABLET | Freq: Every day | ORAL | 0 refills | Status: AC

## 2022-10-26 MED ORDER — MECLIZINE HCL 25 MG PO TABS: 25.0000 mg | ORAL_TABLET | Freq: Three times a day (TID) | ORAL | 0 refills | Status: AC | PRN

## 2022-10-26 NOTE — ED Triage Notes (Signed)
Pt c/o right ear pain in both ears. Onset ~ 4 days ago. Pt states causing dizziness.

## 2022-10-28 NOTE — ED Provider Notes (Signed)
[facility]    CSN: [id number] Arrival date & time: 10/26/22  1656      History   Chief Complaint Chief Complaint  Patient presents with   Dizziness    HPI Lawrence Werner is a 51 y.o. male.   Patient here today with wife for evaluation of dizziness.  He reports that specifically when he stands or when he has been in the bathroom or looking to the left he will have sensation of room spinning.  He notes this has occurred in the past and treatment has been helpful.  Chart review reveals he was diagnosed with vertigo in the past and was prescribed steroid.  He denies any chest pain or shortness of breath.  He has not had any headaches.  He denies any vision changes.  He does not report treatment for symptoms.  He requests to have glucose checked.  The history is provided by the patient. The history is limited by a language barrier. A language interpreter was used (wife).  Dizziness Associated symptoms: headaches and nausea (mild nausea with dizziness)   Associated symptoms: no chest pain, no shortness of breath and no vomiting     Past Medical History:  Diagnosis Date   BPH (benign prostatic hyperplasia) 10/14/2016   Candidiasis, cutaneous 05/30/2016   Diabetes ([place])    Erectile dysfunction 09/20/2015   Hematemesis with nausea 08/31/2015   Hypertension    Lumbar disc disease with radiculopathy 09/20/2015   Refugee health examination 09/20/2015   Urinary frequency 06/17/2016    Patient Active Problem List   Diagnosis Date Noted   Cutaneous skin tags 12/01/2017   GERD (gastroesophageal reflux disease) 08/24/2017   Prediabetes 05/30/2016    History reviewed. No pertinent surgical history.     Home Medications    Prior to Admission medications   Medication Sig Start Date End Date Taking? Authorizing Provider  meclizine (ANTIVERT) 25 MG tablet Take 1 tablet (25 mg total) by mouth 3 (three) times daily as needed for dizziness. 10/26/22  Yes  Francene Finders, PA-C  predniSONE (STERAPRED UNI-PAK 21 TAB) 10 MG (21) TBPK tablet Take by mouth daily. Take 6 tabs by mouth daily  for 2 days, then 5 tabs for 2 days, then 4 tabs for 2 days, then 3 tabs for 2 days, 2 tabs for 2 days, then 1 tab by mouth daily for 2 days 10/26/22  Yes Francene Finders, PA-C  albuterol (VENTOLIN HFA) 108 (90 Base) MCG/ACT inhaler Inhale 1-2 puffs into the lungs every 6 (six) hours as needed for wheezing or shortness of breath. 01/10/22   Ward, Lenise Arena, PA-C  atorvastatin (LIPITOR) 20 MG tablet Take 20 mg by mouth daily. 09/16/22   [provider]  cyclobenzaprine (FLEXERIL) 10 MG tablet Take 1 tablet (10 mg total) by mouth 3 (three) times daily as needed for muscle spasms (Medication may cause severe drowsiness avoid driving). 01/29/21   Volney American, PA-C  diclofenac Sodium (VOLTAREN) 1 % GEL Apply 1 application topically 4 (four) times daily. 12/15/21   Crain, Whitney L, PA  gabapentin (NEURONTIN) 300 MG capsule Take 1 capsule (300 mg total) by mouth 3 (three) times daily. Take one pill today, two tomorrow then take three times daily 01/23/16   Mariel Aloe, MD  lactobacillus acidophilus & bulgar (LACTINEX) chewable tablet Chew 1 tablet by mouth 3 (three) times daily with meals. 05/20/16   Ward, Ozella Almond, PA-C  metFORMIN (GLUCOPHAGE-XR) 500 MG 24 hr tablet Take 500  mg by mouth daily. 09/16/22   [provider]  naproxen (NAPROSYN) 500 MG tablet Take 1 tablet (500 mg total) by mouth 2 (two) times daily as needed. 01/29/21   Particia Nearing, PA-C  omeprazole (PRILOSEC) 40 MG capsule Take 1 capsule (40 mg total) by mouth daily. 12/30/21   Rhys Martini, PA-C  promethazine-dextromethorphan (PROMETHAZINE-DM) 6.25-15 MG/5ML syrup Take 5 mLs by mouth 4 (four) times daily as needed for cough. 01/10/22   Ward, Tylene Fantasia, PA-C  sildenafil (VIAGRA) 100 MG tablet Take 0.5-1 tablets (50-100 mg total) by mouth daily as needed for erectile  dysfunction. 11/04/16   Erasmo Downer, MD  triamcinolone cream (KENALOG) 0.1 % Apply 1 application topically 2 (two) times daily. Apply one application to bilateral hands once daily x 7 days. Do not exceed 14 days of treatment. 12/15/21   Maretta Bees, PA    Family History Family History  Family history unknown: Yes    Social History Social History   Tobacco Use   Smoking status: Never   Smokeless tobacco: Never  Substance Use Topics   Alcohol use: No   Drug use: No     Allergies   Patient has no known allergies.   Review of Systems Review of Systems  Constitutional:  Negative for chills and fever.  Eyes:  Negative for discharge, redness and visual disturbance.  Respiratory:  Negative for shortness of breath.   Cardiovascular:  Negative for chest pain.  Gastrointestinal:  Positive for nausea (mild nausea with dizziness). Negative for vomiting.  Neurological:  Positive for dizziness and headaches.     Physical Exam Triage Vital Signs ED Triage Vitals  Enc Vitals Group     BP 10/26/22 2033 (!) 141/83     Pulse Rate 10/26/22 2033 84     Resp 10/26/22 2033 20     Temp 10/26/22 2033 98.2 F (36.8 C)     Temp Source 10/26/22 2033 Oral     SpO2 10/26/22 2033 95 %     Weight --      Height --      Head Circumference --      Peak Flow --      Pain Score 10/26/22 2035 5     Pain Loc --      Pain Edu? --      Excl. in GC? --    No data found.  Updated Vital Signs BP (!) 141/83 (BP Location: Left Arm)   Pulse 84   Temp 98.2 F (36.8 C) (Oral)   Resp 20   SpO2 95%     Physical Exam Vitals and nursing note reviewed.  Constitutional:      General: He is not in acute distress.    Appearance: Normal appearance. He is not ill-appearing.     Comments: Laughing, joking  HENT:     Head: Normocephalic and atraumatic.     Nose: Nose normal. No congestion or rhinorrhea.  Eyes:     Extraocular Movements: Extraocular movements intact.      Conjunctiva/sclera: Conjunctivae normal.     Pupils: Pupils are equal, round, and reactive to light.     Comments: Patient reports mild dizziness when looking to left with EOM exercises  Cardiovascular:     Rate and Rhythm: Normal rate and regular rhythm.     Heart sounds: Normal heart sounds. No murmur heard. Pulmonary:     Effort: Pulmonary effort is normal. No respiratory distress.     Breath  sounds: Normal breath sounds. No wheezing, rhonchi or rales.  Neurological:     Mental Status: He is alert and oriented to person, place, and time.     Comments: No facial droop, normal speech  Psychiatric:        Mood and Affect: Mood normal.        Behavior: Behavior normal.      UC Treatments / Results  Labs (all labs ordered are listed, but only abnormal results are displayed) Labs Reviewed  POCT FASTING CBG [facility] MANUAL ENTRY - Abnormal; Notable for the following components:      Result Value   POCT Glucose ([facility]) 300 (*)    All other components within normal limits    EKG   Radiology No results found.  Procedures Procedures (including critical care time)  Medications Ordered in UC Medications - No data to display  Initial Impression / Assessment and Plan / UC Course  I have reviewed the triage vital signs and the nursing notes.  Pertinent labs & imaging results that were available during my care of the patient were reviewed by me and considered in my medical decision making (see chart for details).   Suspect likely vertigo given history of same with similar presentation.  Will treat with meclizine and prednisone.  Some concern regarding prednisone given patient is a diabetic but given improvement in the past will trial same.  Patient was advised of glucose in office, and recommended further evaluation and follow-up with primary care as he may need treatment beyond current metformin.  Patient expresses understanding.   Final Clinical Impressions(s) / UC Diagnoses   Final  diagnoses:  Benign paroxysmal positional vertigo, unspecified laterality   Discharge Instructions   None    ED Prescriptions     Medication Sig Dispense Auth. Provider   meclizine (ANTIVERT) 25 MG tablet Take 1 tablet (25 mg total) by mouth 3 (three) times daily as needed for dizziness. 30 tablet Erma Pinto F, PA-C   predniSONE (STERAPRED UNI-PAK 21 TAB) 10 MG (21) TBPK tablet Take by mouth daily. Take 6 tabs by mouth daily  for 2 days, then 5 tabs for 2 days, then 4 tabs for 2 days, then 3 tabs for 2 days, 2 tabs for 2 days, then 1 tab by mouth daily for 2 days 42 tablet Tomi Bamberger, PA-C      PDMP not reviewed this encounter.   Tomi Bamberger, PA-C 10/28/22 [phone]

## 2023-02-15 ENCOUNTER — Encounter (HOSPITAL_COMMUNITY): Payer: Self-pay

## 2023-02-15 ENCOUNTER — Emergency Department (HOSPITAL_COMMUNITY)
Admission: EM | Admit: 2023-02-15 | Discharge: 2023-02-15 | Disposition: A | Discharge status: Home / Self Care | Payer: Medicaid Other | Attending: Emergency Medicine | Admitting: Emergency Medicine

## 2023-02-15 DIAGNOSIS — Z7984 Long term (current) use of oral hypoglycemic drugs: Secondary | ICD-10-CM | POA: Diagnosis not present

## 2023-02-15 DIAGNOSIS — I10 Essential (primary) hypertension: Secondary | ICD-10-CM | POA: Insufficient documentation

## 2023-02-15 DIAGNOSIS — E119 Type 2 diabetes mellitus without complications: Secondary | ICD-10-CM | POA: Insufficient documentation

## 2023-02-15 DIAGNOSIS — K219 Gastro-esophageal reflux disease without esophagitis: Secondary | ICD-10-CM | POA: Insufficient documentation

## 2023-02-15 DIAGNOSIS — Z760 Encounter for issue of repeat prescription: Secondary | ICD-10-CM | POA: Diagnosis not present

## 2023-02-15 DIAGNOSIS — R1010 Upper abdominal pain, unspecified: Secondary | ICD-10-CM | POA: Diagnosis present

## 2023-02-15 MED ORDER — OMEPRAZOLE 40 MG PO CPDR: 40.0000 mg | DELAYED_RELEASE_CAPSULE | Freq: Every day | ORAL | 2 refills | Status: DC

## 2023-02-15 NOTE — ED Triage Notes (Signed)
Pt arrives today c/o epigastric pain. Pt denies N/V/D.   Pt has rx for omeprazole, was nervous to take because it was expired.   Pt just wants new rx.

## 2023-02-15 NOTE — Discharge Instructions (Addendum)
You were seen in the ER for medication refill.  I ordered two refills of the medication so if you run out or if it expires, call the pharmacy and ask if they can refill it.

## 2023-02-15 NOTE — ED Provider Notes (Signed)
[facility] Provider Note   CSN: [id number] Arrival date & time: 02/15/23  1743     History  Chief Complaint  Patient presents with   Medication Refill    Lawrence Werner is a 52 y.o. male with history of BPH, HTN, diabetes who presents to the ER requesting medication refill. Was prescribed omeprazole for acid reflux but prescription has expired. Has been having some upper abdominal discomfort which he attributes to reflux. He does not want further evaluation and just is requesting medication refill.    Medication Refill      Home Medications Prior to Admission medications   Medication Sig Start Date End Date Taking? Authorizing Provider  omeprazole (PRILOSEC) 40 MG capsule Take 1 capsule (40 mg total) by mouth daily. 02/15/23  Yes Swathi Dauphin T, PA-C  albuterol (VENTOLIN HFA) 108 (90 Base) MCG/ACT inhaler Inhale 1-2 puffs into the lungs every 6 (six) hours as needed for wheezing or shortness of breath. 01/10/22   Ward, Lenise Arena, PA-C  atorvastatin (LIPITOR) 20 MG tablet Take 20 mg by mouth daily. 09/16/22   [provider]  cyclobenzaprine (FLEXERIL) 10 MG tablet Take 1 tablet (10 mg total) by mouth 3 (three) times daily as needed for muscle spasms (Medication may cause severe drowsiness avoid driving). 01/29/21   Volney American, PA-C  diclofenac Sodium (VOLTAREN) 1 % GEL Apply 1 application topically 4 (four) times daily. 12/15/21   Crain, Whitney L, PA  gabapentin (NEURONTIN) 300 MG capsule Take 1 capsule (300 mg total) by mouth 3 (three) times daily. Take one pill today, two tomorrow then take three times daily 01/23/16   Mariel Aloe, MD  lactobacillus acidophilus & bulgar (LACTINEX) chewable tablet Chew 1 tablet by mouth 3 (three) times daily with meals. 05/20/16   Ward, Ozella Almond, PA-C  meclizine (ANTIVERT) 25 MG tablet Take 1 tablet (25 mg total) by mouth 3 (three) times daily as needed for  dizziness. 10/26/22   Francene Finders, PA-C  metFORMIN (GLUCOPHAGE-XR) 500 MG 24 hr tablet Take 500 mg by mouth daily. 09/16/22   [provider]  naproxen (NAPROSYN) 500 MG tablet Take 1 tablet (500 mg total) by mouth 2 (two) times daily as needed. 01/29/21   Volney American, PA-C  predniSONE (STERAPRED UNI-PAK 21 TAB) 10 MG (21) TBPK tablet Take by mouth daily. Take 6 tabs by mouth daily  for 2 days, then 5 tabs for 2 days, then 4 tabs for 2 days, then 3 tabs for 2 days, 2 tabs for 2 days, then 1 tab by mouth daily for 2 days 10/26/22   Francene Finders, PA-C  promethazine-dextromethorphan (PROMETHAZINE-DM) 6.25-15 MG/5ML syrup Take 5 mLs by mouth 4 (four) times daily as needed for cough. 01/10/22   Ward, Lenise Arena, PA-C  sildenafil (VIAGRA) 100 MG tablet Take 0.5-1 tablets (50-100 mg total) by mouth daily as needed for erectile dysfunction. 11/04/16   Virginia Crews, MD  triamcinolone cream (KENALOG) 0.1 % Apply 1 application topically 2 (two) times daily. Apply one application to bilateral hands once daily x 7 days. Do not exceed 14 days of treatment. 12/15/21   Geryl Councilman L, PA      Allergies    Patient has no known allergies.    Review of Systems   Review of Systems  Constitutional:  Negative for fever.  Cardiovascular:  Negative for chest pain.  Gastrointestinal:  Positive for abdominal pain. Negative for diarrhea, nausea  and vomiting.  All other systems reviewed and are negative.   Physical Exam Updated Vital Signs BP 133/87 (BP Location: Right Arm)   Pulse 75   Temp 98.3 F (36.8 C) (Oral)   Resp 18   Ht 5' 8.5" (1.74 m)   Wt 91.2 kg   SpO2 96%   BMI 30.12 kg/m  Physical Exam Vitals and nursing note reviewed.  Constitutional:      Appearance: Normal appearance.  HENT:     Head: Normocephalic and atraumatic.  Eyes:     Conjunctiva/sclera: Conjunctivae normal.  Pulmonary:     Effort: Pulmonary effort is normal. No respiratory distress.   Abdominal:     General: Abdomen is flat.     Palpations: Abdomen is soft.     Tenderness: There is no abdominal tenderness.  Skin:    General: Skin is warm and dry.  Neurological:     Mental Status: He is alert.  Psychiatric:        Mood and Affect: Mood normal.        Behavior: Behavior normal.     ED Results / Procedures / Treatments   Labs (all labs ordered are listed, but only abnormal results are displayed) Labs Reviewed - No data to display  EKG None  Radiology No results found.  Procedures Procedures    Medications Ordered in ED Medications - No data to display  ED Course/ Medical Decision Making/ A&P                             Medical Decision Making Risk Prescription drug management.   Patient is a 52 year old male with history of BPH, HTN, diabetes who presents to the ER requesting medication refill of 40 mg omeprazole daily.   On exam, patient in no acute distress. Expressing abdominal discomfort but abdomen is soft and non-tender. No other complaints or associated symptoms.  He does not want further evaluation such as lab work or imaging although full evaluation was offered. He declined and I believe he demonstrates capacity to make his own medical decisions.  Prescription sent for omeprazole for 30 days with 2 refills. Encouraged follow up with PCP for any further medication needs. Recommended follow up to ER if he desires evaluation for his symptoms.   Final Clinical Impression(s) / ED Diagnoses Final diagnoses:  Medication refill  Gastroesophageal reflux disease without esophagitis    Rx / DC Orders ED Discharge Orders          Ordered    omeprazole (PRILOSEC) 40 MG capsule  Daily        02/15/23 1816           Portions of this report may have been transcribed using voice recognition software. Every effort was made to ensure accuracy; however, inadvertent computerized transcription errors may be present.    Estill Cotta 02/15/23 1837    Wyvonnia Dusky, MD 02/15/23 2206

## 2024-08-21 ENCOUNTER — Emergency Department (HOSPITAL_COMMUNITY)
Admission: EM | Admit: 2024-08-21 | Discharge: 2024-08-21 | Disposition: A | Discharge status: Home / Self Care | Attending: Emergency Medicine | Admitting: Emergency Medicine

## 2024-08-21 ENCOUNTER — Other Ambulatory Visit: Payer: Self-pay

## 2024-08-21 ENCOUNTER — Emergency Department (HOSPITAL_COMMUNITY)

## 2024-08-21 ENCOUNTER — Encounter (HOSPITAL_COMMUNITY): Payer: Self-pay | Admitting: Emergency Medicine

## 2024-08-21 DIAGNOSIS — Z7984 Long term (current) use of oral hypoglycemic drugs: Secondary | ICD-10-CM | POA: Insufficient documentation

## 2024-08-21 DIAGNOSIS — R109 Unspecified abdominal pain: Secondary | ICD-10-CM | POA: Diagnosis present

## 2024-08-21 DIAGNOSIS — I10 Essential (primary) hypertension: Secondary | ICD-10-CM | POA: Diagnosis not present

## 2024-08-21 DIAGNOSIS — E119 Type 2 diabetes mellitus without complications: Secondary | ICD-10-CM | POA: Insufficient documentation

## 2024-08-21 DIAGNOSIS — N200 Calculus of kidney: Secondary | ICD-10-CM

## 2024-08-21 DIAGNOSIS — N132 Hydronephrosis with renal and ureteral calculous obstruction: Secondary | ICD-10-CM | POA: Insufficient documentation

## 2024-08-21 LAB — COMPREHENSIVE METABOLIC PANEL WITH GFR
ALT: 17 U/L (ref 0–44)
AST: 14 U/L — ABNORMAL LOW (ref 15–41)
Albumin: 3.7 g/dL (ref 3.5–5.0)
Alkaline Phosphatase: 74 U/L (ref 38–126)
Anion gap: 8 (ref 5–15)
BUN: 19 mg/dL (ref 6–20)
CO2: 23 mmol/L (ref 22–32)
Calcium: 9.5 mg/dL (ref 8.9–10.3)
Chloride: 106 mmol/L (ref 98–111)
Creatinine, Ser: 1.09 mg/dL (ref 0.61–1.24)
GFR, Estimated: >60 mL/min (ref >60)
Glucose, Bld: 264 mg/dL — ABNORMAL HIGH (ref 70–99)
Potassium: 3.7 mmol/L (ref 3.5–5.1)
Sodium: 137 mmol/L (ref 135–145)
Total Bilirubin: 0.6 mg/dL (ref 0.0–1.2)
Total Protein: 6.8 g/dL (ref 6.5–8.1)

## 2024-08-21 LAB — CBC WITH DIFFERENTIAL/PLATELET
Abs Immature Granulocytes: 0.07 K/uL (ref 0.00–0.07)
Basophils Absolute: 0 K/uL (ref 0.0–0.1)
Basophils Relative: 0 %
Eosinophils Absolute: 0 K/uL (ref 0.0–0.5)
Eosinophils Relative: 0 %
HCT: 39.6 % (ref 39.0–52.0)
Hemoglobin: 13.1 g/dL (ref 13.0–17.0)
Immature Granulocytes: 1 %
Lymphocytes Relative: 8 %
Lymphs Abs: 1 K/uL (ref 0.7–4.0)
MCH: 27.2 pg (ref 26.0–34.0)
MCHC: 33.1 g/dL (ref 30.0–36.0)
MCV: 82.2 fL (ref 80.0–100.0)
Monocytes Absolute: 0.8 K/uL (ref 0.1–1.0)
Monocytes Relative: 6 %
Neutro Abs: 11.1 K/uL — ABNORMAL HIGH (ref 1.7–7.7)
Neutrophils Relative %: 85 %
Platelets: 226 K/uL (ref 150–400)
RBC: 4.82 MIL/uL (ref 4.22–5.81)
RDW: 12.9 % (ref 11.5–15.5)
WBC: 13 K/uL — ABNORMAL HIGH (ref 4.0–10.5)
nRBC: 0 % (ref 0.0–0.2)

## 2024-08-21 LAB — URINALYSIS, ROUTINE W REFLEX MICROSCOPIC
Bacteria, UA: NONE SEEN
Bilirubin Urine: NEGATIVE
Glucose, UA: >=500 mg/dL — AB
Ketones, ur: 5 mg/dL — AB
Leukocytes,Ua: NEGATIVE
Nitrite: NEGATIVE
Protein, ur: 30 mg/dL — AB
Specific Gravity, Urine: 1.026 (ref 1.005–1.030)
pH: 5 (ref 5.0–8.0)

## 2024-08-21 LAB — LIPASE, BLOOD: Lipase: 23 U/L (ref 11–51)

## 2024-08-21 MED ORDER — OXYCODONE-ACETAMINOPHEN 5-325 MG PO TABS
1.0000 | ORAL_TABLET | Freq: Once | ORAL | Status: AC
  Administered 2024-08-21: 1 via ORAL
  Filled 2024-08-21: qty 1

## 2024-08-21 MED ORDER — ONDANSETRON HCL 4 MG/2ML IJ SOLN
4.0000 mg | Freq: Once | INTRAMUSCULAR | Status: AC
  Administered 2024-08-21: 4 mg via INTRAVENOUS
  Filled 2024-08-21: qty 2

## 2024-08-21 MED ORDER — ONDANSETRON 4 MG PO TBDP: 4.0000 mg | ORAL_TABLET | Freq: Three times a day (TID) | ORAL | 0 refills | Status: AC | PRN

## 2024-08-21 MED ORDER — KETOROLAC TROMETHAMINE 15 MG/ML IJ SOLN
15.0000 mg | Freq: Once | INTRAMUSCULAR | Status: AC
  Administered 2024-08-21: 15 mg via INTRAVENOUS
  Filled 2024-08-21: qty 1

## 2024-08-21 MED ORDER — OMEPRAZOLE 40 MG PO CPDR
40.0000 mg | DELAYED_RELEASE_CAPSULE | Freq: Every day | ORAL | 0 refills | Status: AC
Start: 2024-08-21 — End: ?

## 2024-08-21 MED ORDER — OXYCODONE HCL 5 MG PO TABS: 5.0000 mg | ORAL_TABLET | Freq: Four times a day (QID) | ORAL | 0 refills | Status: DC | PRN

## 2024-08-21 MED ORDER — MORPHINE SULFATE (PF) 4 MG/ML IV SOLN
4.0000 mg | Freq: Once | INTRAVENOUS | Status: AC
  Administered 2024-08-21: 4 mg via INTRAVENOUS
  Filled 2024-08-21: qty 1

## 2024-08-21 MED ORDER — SODIUM CHLORIDE 0.9 % IV BOLUS
500.0000 mL | Freq: Once | INTRAVENOUS | Status: AC
  Administered 2024-08-21: 500 mL via INTRAVENOUS

## 2024-08-21 MED ORDER — TAMSULOSIN HCL 0.4 MG PO CAPS: 0.4000 mg | ORAL_CAPSULE | Freq: Every day | ORAL | 0 refills | Status: AC

## 2024-08-21 NOTE — ED Notes (Addendum)
Pt requesting pain medication. Message sent to provider.

## 2024-08-21 NOTE — Discharge Instructions (Addendum)
 As discussed, you can follow up with Urology and with Primary Care. Seek emergency care if experiencing any new or worsening symptoms.   Alternating between 650 mg Tylenol  and 400 mg Advil : The best way to alternate taking Acetaminophen  (example Tylenol ) and Ibuprofen  (example Advil /Motrin ) is to take them 3 hours apart. For example, if you take ibuprofen  at 6 am you can then take Tylenol  at 9 am. You can continue this regimen throughout the day, making sure you do not exceed the recommended maximum dose for each drug.  ??? ????? ??????? ????? ???????? ?? ??? ??????? ??????? ???????? ?????? ???????. ???? ??????? ??????? ??? ???? ???? ?? ????? ????? ?? ??????.  ??????? ??? 650 ??? ???????? ?400 ??? ?????:  ???? ????? ??????? ??? ????? ?????????????? (??? ????????) ????????????? (??? ?????/??????) ?? ???????? ????? ???? ???? 3 ?????. ??? ???? ??????? ??? ??? ?????? ???????????? ?????? 6 ??????? ????? ????? ???????? ?????? 9 ??????. ????? ????????? ?? ??? ?????? ???? ?????? ?? ????? ??? ??? ????? ?????? ?????? ?????? ??? ??? ????.

## 2024-08-21 NOTE — ED Notes (Signed)
 Discharge instructions, medications, and follow up care reviewed with and provided to pt. Pt denies any further questions, and has verbalized understanding.

## 2024-08-21 NOTE — ED Triage Notes (Signed)
 Pt reports left sided flank pain since yesterday. Reports it has gotten worse this morning. Also dysuria. Pain 10/10

## 2024-08-21 NOTE — ED Provider Notes (Signed)
 Accepted handoff at shift change from [facility]. Please see prior provider note for more detail.   Briefly: Patient is 53 y.o. complaint of left flank and abdominal pain with nausea and vomiting. Symptoms started yesterday, progressively worsening this morning. Also reports pain with urinating. No history of kidney stones.  Plan:  [facility] #[id number] assisting with Arabic translation. - UA not concerning for infection.  CBC with leukocytosis at 13.0.  No anemia.  CMP with hyperglycemia 264.  Lipase within normal limits.  CT renal stone study showing 5 mm stone in left ureter and mild hydronephrosis. - Shared all results with patient.  Answered all questions.  Patient's pain controlled with p.o. Percocet and Toradol . - Will prescribe patient Flomax , Percocet, Zofran .  Educated patient on alternating Advil  and Tylenol  for initial pain management.  Patient also requesting a refill of his omeprazole  which I have prescribed.  Patient given information for urology follow-up.  Patient also understands to follow-up with PCP. - Patient afebrile with stable vitals.  Provided with return precautions.  Discharged in good condition.       Hoy Nidia FALCON, [place] 08/21/24 9074    Griselda Norris, MD 08/22/24 [phone]

## 2024-08-21 NOTE — ED Provider Notes (Signed)
 [place] EMERGENCY DEPARTMENT AT [facility] Provider Note   CSN: [id number] Arrival date & time: 08/21/24  9542     Patient presents with: Flank Pain   Lawrence Werner is a 54 y.o. male.   53 year old male presents with complaint of left flank and abdominal pain with nausea and vomiting.  Symptoms started yesterday, progressively worsening this morning.  Also reports pain with urinating.  No history of kidney stones.  Patient speaks Arabic, formal Arabic interpreter is not available at this time.  Patient's friend at bedside assists with translation.       Prior to Admission medications   Medication Sig Start Date End Date Taking? Authorizing Provider  albuterol  (VENTOLIN  HFA) 108 (90 Base) MCG/ACT inhaler Inhale 1-2 puffs into the lungs every 6 (six) hours as needed for wheezing or shortness of breath. 01/10/22   Ward, Harlene PEDLAR, PA-C  atorvastatin (LIPITOR) 20 MG tablet Take 20 mg by mouth daily. 09/16/22   [provider]  cyclobenzaprine  (FLEXERIL ) 10 MG tablet Take 1 tablet (10 mg total) by mouth 3 (three) times daily as needed for muscle spasms (Medication may cause severe drowsiness avoid driving). 01/29/21   Stuart Vernell Norris, PA-C  diclofenac  Sodium (VOLTAREN ) 1 % GEL Apply 1 application topically 4 (four) times daily. 12/15/21   Crain, Whitney L, PA  gabapentin  (NEURONTIN ) 300 MG capsule Take 1 capsule (300 mg total) by mouth 3 (three) times daily. Take one pill today, two tomorrow then take three times daily 01/23/16   Briana Elgin LABOR, MD  lactobacillus acidophilus & bulgar (LACTINEX) chewable tablet Chew 1 tablet by mouth 3 (three) times daily with meals. 05/20/16   Ward, Ami Copes, PA-C  meclizine  (ANTIVERT ) 25 MG tablet Take 1 tablet (25 mg total) by mouth 3 (three) times daily as needed for dizziness. 10/26/22   Billy Asberry FALCON, PA-C  metFORMIN (GLUCOPHAGE-XR) 500 MG 24 hr tablet Take 500 mg by mouth daily. 09/16/22   [provider]  naproxen  (NAPROSYN ) 500 MG tablet Take 1 tablet (500 mg total) by mouth 2 (two) times daily as needed. 01/29/21   Stuart Vernell Norris, PA-C  omeprazole  (PRILOSEC) 40 MG capsule Take 1 capsule (40 mg total) by mouth daily. 02/15/23   Roemhildt, Lorin T, PA-C  predniSONE  (STERAPRED UNI-PAK 21 TAB) 10 MG (21) TBPK tablet Take by mouth daily. Take 6 tabs by mouth daily  for 2 days, then 5 tabs for 2 days, then 4 tabs for 2 days, then 3 tabs for 2 days, 2 tabs for 2 days, then 1 tab by mouth daily for 2 days 10/26/22   Billy Asberry FALCON, PA-C  promethazine -dextromethorphan (PROMETHAZINE -DM) 6.25-15 MG/5ML syrup Take 5 mLs by mouth 4 (four) times daily as needed for cough. 01/10/22   Ward, Jessica Z, PA-C  sildenafil  (VIAGRA ) 100 MG tablet Take 0.5-1 tablets (50-100 mg total) by mouth daily as needed for erectile dysfunction. 11/04/16   Bacigalupo, Angela M, MD  triamcinolone  cream (KENALOG ) 0.1 % Apply 1 application topically 2 (two) times daily. Apply one application to bilateral hands once daily x 7 days. Do not exceed 14 days of treatment. 12/15/21   Crain, Whitney L, PA    Allergies: Patient has no known allergies.    Review of Systems Negative except as per HPI Updated Vital Signs BP (!) 172/121 (BP Location: Left Arm)   Pulse 72   Temp 98.2 F (36.8 C) (Oral)   Resp 20   SpO2 99%   Physical  Exam Vitals and nursing note reviewed.  Constitutional:      General: He is not in acute distress.    Appearance: He is well-developed. He is not diaphoretic.  HENT:     Head: Normocephalic and atraumatic.  Cardiovascular:     Rate and Rhythm: Normal rate and regular rhythm.     Heart sounds: Normal heart sounds.  Pulmonary:     Effort: Pulmonary effort is normal.     Breath sounds: Normal breath sounds.  Abdominal:     Palpations: Abdomen is soft.     Tenderness: There is abdominal tenderness. There is left CVA tenderness. There is no right CVA tenderness.  Skin:    General:  Skin is warm and dry.     Findings: No erythema or rash.  Neurological:     Mental Status: He is alert and oriented to person, place, and time.  Psychiatric:        Behavior: Behavior normal.     (all labs ordered are listed, but only abnormal results are displayed) Labs Reviewed  URINALYSIS, ROUTINE W REFLEX MICROSCOPIC  CBC WITH DIFFERENTIAL/PLATELET  COMPREHENSIVE METABOLIC PANEL WITH GFR  LIPASE, BLOOD    EKG: None  Radiology: No results found.   Procedures   Medications Ordered in the ED  ondansetron  (ZOFRAN ) injection 4 mg (4 mg Intravenous Given 08/21/24 0626)  morphine  (PF) 4 MG/ML injection 4 mg (4 mg Intravenous Given 08/21/24 0627)  sodium chloride  0.9 % bolus 500 mL (500 mLs Intravenous New Bag/Given 08/21/24 9370)                                    Medical Decision Making Amount and/or Complexity of Data Reviewed Labs: ordered. Radiology: ordered.  Risk Prescription drug management.   This patient presents to the ED for concern of abdominal pain, dysuria, vomiting, this involves an extensive number of treatment options, and is a complaint that carries with it a high risk of complications and morbidity.  The differential diagnosis includes but not limited to kidney stone, pyelonephritis, urinary tract   Co morbidities / Chronic conditions that complicate the patient evaluation  BPH, hypertension, diabetes   Additional history obtained:  Additional history obtained from EMR External records from outside source obtained and reviewed including prior labs and imaging on file   Lab Tests:  Labs pending at time of signout   Imaging Studies ordered:  I ordered imaging studies including CT stone study Pending at time of signout  Problem List / ED Course / Critical interventions / Medication management  53 year old male presents with complaint of pain in his abdomen and left flank with vomiting.  No history of kidney stones.  Found of generalized  abdominal discomfort left CVA tenderness.  Workup pending at time of signout oncoming provider at change of shift. I ordered medication including morphine , Zofran    Social Determinants of Health:  As PCP   Test / Admission - Considered:  Disposition pending at time of signout      Final diagnoses:  None    ED Discharge Orders     None          Beverley Leita DELENA DEVONNA 08/21/24 9360    Griselda Norris, MD 08/22/24 [phone]

## 2024-08-22 ENCOUNTER — Emergency Department (HOSPITAL_COMMUNITY): Admission: EM | Admit: 2024-08-22 | Discharge: 2024-08-22 | Disposition: A | Discharge status: Home / Self Care

## 2024-08-22 ENCOUNTER — Other Ambulatory Visit: Payer: Self-pay

## 2024-08-22 ENCOUNTER — Encounter (HOSPITAL_COMMUNITY): Payer: Self-pay | Admitting: Emergency Medicine

## 2024-08-22 DIAGNOSIS — N201 Calculus of ureter: Secondary | ICD-10-CM | POA: Diagnosis not present

## 2024-08-22 DIAGNOSIS — R109 Unspecified abdominal pain: Secondary | ICD-10-CM | POA: Diagnosis present

## 2024-08-22 LAB — CBC
HCT: 43 % (ref 39.0–52.0)
Hemoglobin: 13.8 g/dL (ref 13.0–17.0)
MCH: 26.5 pg (ref 26.0–34.0)
MCHC: 32.1 g/dL (ref 30.0–36.0)
MCV: 82.7 fL (ref 80.0–100.0)
Platelets: 241 K/uL (ref 150–400)
RBC: 5.2 MIL/uL (ref 4.22–5.81)
RDW: 12.9 % (ref 11.5–15.5)
WBC: 11.5 K/uL — ABNORMAL HIGH (ref 4.0–10.5)
nRBC: 0 % (ref 0.0–0.2)

## 2024-08-22 LAB — URINALYSIS, ROUTINE W REFLEX MICROSCOPIC
Bilirubin Urine: NEGATIVE
Glucose, UA: >=500 mg/dL — AB
Ketones, ur: NEGATIVE mg/dL
Leukocytes,Ua: NEGATIVE
Nitrite: NEGATIVE
Protein, ur: NEGATIVE mg/dL
Specific Gravity, Urine: 1.02 (ref 1.005–1.030)
pH: 6 (ref 5.0–8.0)

## 2024-08-22 LAB — URINALYSIS, MICROSCOPIC (REFLEX): Bacteria, UA: NONE SEEN

## 2024-08-22 LAB — BASIC METABOLIC PANEL WITH GFR
Anion gap: 15 (ref 5–15)
BUN: 13 mg/dL (ref 6–20)
CO2: 22 mmol/L (ref 22–32)
Calcium: 10.2 mg/dL (ref 8.9–10.3)
Chloride: 97 mmol/L — ABNORMAL LOW (ref 98–111)
Creatinine, Ser: 1.23 mg/dL (ref 0.61–1.24)
GFR, Estimated: >60 mL/min (ref >60)
Glucose, Bld: 214 mg/dL — ABNORMAL HIGH (ref 70–99)
Potassium: 4 mmol/L (ref 3.5–5.1)
Sodium: 135 mmol/L (ref 135–145)

## 2024-08-22 MED ORDER — MORPHINE SULFATE (PF) 4 MG/ML IV SOLN
4.0000 mg | Freq: Once | INTRAVENOUS | Status: AC
  Administered 2024-08-22: 4 mg via INTRAVENOUS
  Filled 2024-08-22: qty 1

## 2024-08-22 MED ORDER — ETODOLAC 200 MG PO CAPS: 200.0000 mg | ORAL_CAPSULE | Freq: Three times a day (TID) | ORAL | 0 refills | Status: AC

## 2024-08-22 MED ORDER — HYDROMORPHONE HCL 1 MG/ML IJ SOLN
0.5000 mg | Freq: Once | INTRAMUSCULAR | Status: AC
  Administered 2024-08-22: 0.5 mg via INTRAVENOUS
  Filled 2024-08-22: qty 1

## 2024-08-22 MED ORDER — LACTATED RINGERS IV BOLUS
1000.0000 mL | Freq: Once | INTRAVENOUS | Status: AC
  Administered 2024-08-22: 1000 mL via INTRAVENOUS

## 2024-08-22 MED ORDER — OXYCODONE HCL 5 MG PO TABS: 5.0000 mg | ORAL_TABLET | Freq: Four times a day (QID) | ORAL | 0 refills | Status: AC | PRN

## 2024-08-22 MED ORDER — KETOROLAC TROMETHAMINE 15 MG/ML IJ SOLN
15.0000 mg | Freq: Once | INTRAMUSCULAR | Status: AC
  Administered 2024-08-22: 15 mg via INTRAVENOUS
  Filled 2024-08-22: qty 1

## 2024-08-22 MED ORDER — ONDANSETRON HCL 4 MG/2ML IJ SOLN
4.0000 mg | Freq: Once | INTRAMUSCULAR | Status: AC
  Administered 2024-08-22: 4 mg via INTRAVENOUS
  Filled 2024-08-22: qty 2

## 2024-08-22 NOTE — Discharge Instructions (Addendum)
 You had a 5mm kidney stone located in your ureter. Strain all urine with a strainer that was provided to you.  Make sure you drink plenty of water to maintain hydration.  I am prescribing you an anti-inflammatory called etodolac .  Take this 3 times a day every 8 hours.  You may also take the oxycodone  every 6 hours and may take 1 to 2 tablets.  I have prescribed you an additional 10 tablets of this to go home with in addition to the medication you have at home already. Take the tamsulosin  once daily as prescribed.  Zofran  for nausea as needed   Follow up with [facility] I have given you the information for their office.  Generally kidney stones pass on their own given time in the focus of treatment is minimizing pain  IF YOU HAVE RECURRENCE OF YOUR PAIN WHILE TAKING THE OXYCODONE  AND etodolac  CALL [facility] FIRST TO SEE IF THEY CAN SEE YOU SOONER. You may always return to the ER for any new or worsening symptoms.    ??? ???? ???? ????? ???? 5 ?? ?? ?????. ???? ????? ???????? ????? ?????? ??. ???? ??? ??? ???? ????? ?? ????? ?????? ??? ????? ????.  ??? ?? ????? ?????? ?????????? ????? ?????????. ?????? 3 ???? ?????? ?? 8 ?????. ????? ????? ????? ?????????? ?? 6 ?????? ?????? ????? ??? ?? ?????. ???? ?? 10 ????? ?????? ?? ??? ?????? ?????? ??? ??????? ???????? ??? ?????? ???? ???? ?? ??????. ????? ?????????? ??? ????? ?????? ??? ?????? ??????. ?????? ????? ??????? ??? ??????.  ???? ?? ????? ?????? ?????? ??????? ???????. ??? ?????? ????????? ???????.  ????? ?? ????? ????? ????? ?? ????? ?????? ?????? ?????? ??? ????? ?????.  ??? ????? ????? ????? ????? ?????????? ??????????? ????? ????? ????? ??????? ??????? ????? ?????? ??????? ???? ?? ???? ???. ????? ?????? ?????? ??? ??????? ??? ????? ????? ?? ???????.

## 2024-08-22 NOTE — ED Provider Notes (Signed)
  EMERGENCY DEPARTMENT AT [facility] Provider Note   CSN: [id number] Arrival date & time: 08/22/24  1025     Patient presents with: Flank Pain   Lawrence Werner is a 53 y.o. male.    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  He has had continued pain since being discharged.  He states his nausea is well-controlled he is tolerating p.o.  He states that the pain is sharp and seems to be lower flank.  No fever or chills.     Prior to Admission medications   Medication Sig Start Date End Date Taking? Authorizing Provider  etodolac  (LODINE ) 200 MG capsule Take 1 capsule (200 mg total) by mouth 3 (three) times daily for 3 days. 08/22/24 08/25/24 Yes Orlena Garmon, Hamp RAMAN, PA  albuterol  (VENTOLIN  HFA) 108 (90 Base) MCG/ACT inhaler Inhale 1-2 puffs into the lungs every 6 (six) hours as needed for wheezing or shortness of breath. 01/10/22   Ward, Harlene PEDLAR, PA-C  atorvastatin (LIPITOR) 20 MG tablet Take 20 mg by mouth daily. 09/16/22   [provider]  cyclobenzaprine  (FLEXERIL ) 10 MG tablet Take 1 tablet (10 mg total) by mouth 3 (three) times daily as needed for muscle spasms (Medication may cause severe drowsiness avoid driving). 01/29/21   Stuart Vernell Norris, PA-C  diclofenac  Sodium (VOLTAREN ) 1 % GEL Apply 1 application topically 4 (four) times daily. 12/15/21   Crain, Whitney L, PA  gabapentin  (NEURONTIN ) 300 MG capsule Take 1 capsule (300 mg total) by mouth 3 (three) times daily. Take one pill today, two tomorrow then take three times daily 01/23/16   Briana Elgin LABOR, MD  lactobacillus acidophilus & bulgar (LACTINEX) chewable tablet Chew 1 tablet by mouth 3 (three) times daily with meals. 05/20/16   Ward, Ami Copes, PA-C  meclizine  (ANTIVERT ) 25 MG tablet Take 1 tablet  (25 mg total) by mouth 3 (three) times daily as needed for dizziness. 10/26/22   Billy Asberry FALCON, PA-C  metFORMIN (GLUCOPHAGE-XR) 500 MG 24 hr tablet Take 500 mg by mouth daily. 09/16/22   [provider]  methocarbamol (ROBAXIN) 500 MG tablet Take by mouth. 06/09/24   [provider]  naproxen  (NAPROSYN ) 500 MG tablet Take 1 tablet (500 mg total) by mouth 2 (two) times daily as needed. 01/29/21   Stuart Vernell Norris, PA-C  omeprazole  (PRILOSEC) 40 MG capsule Take 1 capsule (40 mg total) by mouth daily. 08/21/24   Hoy Nidia FALCON, PA-C  ondansetron  (ZOFRAN -ODT) 4 MG disintegrating tablet Take 1 tablet (4 mg total) by mouth every 8 (eight) hours as needed for nausea. 08/21/24   Hoy Nidia FALCON, PA-C  oxyCODONE  (ROXICODONE ) 5 MG immediate release tablet Take 1 tablet (5 mg total) by mouth every 6 (six) hours as needed for up to 10 doses. 08/22/24   Neldon Hamp RAMAN, PA  predniSONE  (STERAPRED UNI-PAK 21 TAB) 10 MG (21) TBPK tablet Take by mouth daily. Take 6 tabs by mouth daily  for 2 days, then 5 tabs for 2 days, then 4 tabs for 2 days, then 3 tabs for 2 days, 2 tabs for 2 days, then 1 tab by mouth daily for 2 days 10/26/22   Billy Asberry FALCON, PA-C  promethazine -dextromethorphan (PROMETHAZINE -DM) 6.25-15 MG/5ML syrup Take 5 mLs by mouth 4 (four) times daily as needed for  cough. 01/10/22   Ward, Harlene PEDLAR, PA-C  sildenafil  (VIAGRA ) 100 MG tablet Take 0.5-1 tablets (50-100 mg total) by mouth daily as needed for erectile dysfunction. 11/04/16   Myrla Jon HERO, MD  tamsulosin  (FLOMAX ) 0.4 MG CAPS capsule Take 1 capsule (0.4 mg total) by mouth daily for 5 days. 08/21/24 08/26/24  Hoy Nidia FALCON, PA-C  triamcinolone  cream (KENALOG ) 0.1 % Apply 1 application topically 2 (two) times daily. Apply one application to bilateral hands once daily x 7 days. Do not exceed 14 days of treatment. 12/15/21   Crain, Whitney L, PA    Allergies: Patient has no known allergies.    Review of  Systems  Genitourinary:  Positive for flank pain.    Updated Vital Signs BP (!) 146/88   Pulse 77   Temp 98.4 F (36.9 C) (Oral)   Resp 17   Ht 5' 8 (1.727 m)   Wt 85 kg   SpO2 97%   BMI 28.49 kg/m   Physical Exam Vitals and nursing note reviewed.  Constitutional:      General: He is not in acute distress. HENT:     Head: Normocephalic and atraumatic.     Nose: Nose normal.  Eyes:     General: No scleral icterus. Cardiovascular:     Rate and Rhythm: Normal rate and regular rhythm.     Pulses: Normal pulses.     Heart sounds: Normal heart sounds.  Pulmonary:     Effort: Pulmonary effort is normal. No respiratory distress.     Breath sounds: No wheezing.  Abdominal:     Palpations: Abdomen is soft.     Tenderness: There is no abdominal tenderness. There is left CVA tenderness. There is no right CVA tenderness.  Musculoskeletal:     Cervical back: Normal range of motion.     Right lower leg: No edema.     Left lower leg: No edema.  Skin:    General: Skin is warm and dry.     Capillary Refill: Capillary refill takes less than 2 seconds.  Neurological:     Mental Status: He is alert. Mental status is at baseline.  Psychiatric:        Mood and Affect: Mood normal.        Behavior: Behavior normal.     (all labs ordered are listed, but only abnormal results are displayed) Labs Reviewed  URINALYSIS, ROUTINE W REFLEX MICROSCOPIC - Abnormal; Notable for the following components:      Result Value   Glucose, UA >=500 (*)    Hgb urine dipstick TRACE (*)    All other components within normal limits  CBC - Abnormal; Notable for the following components:   WBC 11.5 (*)    All other components within normal limits  BASIC METABOLIC PANEL WITH GFR - Abnormal; Notable for the following components:   Chloride 97 (*)    Glucose, Bld 214 (*)    All other components within normal limits  URINALYSIS, MICROSCOPIC (REFLEX)    EKG: None  Radiology: CT RENAL STONE  STUDY Result Date: 08/21/2024 EXAM: CT UROGRAM 08/21/2024 07:14:33 AM TECHNIQUE: CT of the abdomen and pelvis was performed before and after the administration of intravenous contrast as per CT urogram protocol. Multiplanar reformatted images as well as MIP urogram images are provided for review. Automated exposure control, iterative reconstruction, and/or weight based adjustment of the mA/kV was utilized to reduce the radiation dose to as low as reasonably achievable. COMPARISON: 08/05/2015 CLINICAL HISTORY:  Abdominal/flank pain, stone suspected. FINDINGS: LOWER CHEST: Calcified granulomas noted in the right lower lobe. LIVER: The liver is unremarkable. GALLBLADDER AND BILE DUCTS: Gallbladder is unremarkable. No biliary ductal dilatation. SPLEEN: No acute abnormality. PANCREAS: No acute abnormality. ADRENAL GLANDS: No acute abnormality. KIDNEYS, URETERS AND BLADDER: Mild left hydronephrosis and left perinephric fat stranding. Stone within the proximal left ureter measures 5 mm, image 47/2. Urinary bladder is unremarkable. GI AND BOWEL: Diminutive, normal-appearing appendix identified. There is no bowel obstruction. PERITONEUM AND RETROPERITONEUM: No ascites. No free air. VASCULATURE: Aortic atherosclerotic calcification. No aneurysm. LYMPH NODES: No lymphadenopathy. REPRODUCTIVE ORGANS: Prostate gland appears normal. BONES AND SOFT TISSUES: L5-S1 degenerative disc disease. No acute osseous abnormality. No focal soft tissue abnormality. IMPRESSION: 1. Mild left hydronephrosis and left perinephric fat stranding, secondary to a 5 mm stone within the proximal left ureter. Electronically signed by: Waddell Calk MD 08/21/2024 07:36 AM EDT RP Workstation: [id number]     Procedures   Medications Ordered in the ED  morphine  (PF) 4 MG/ML injection 4 mg (4 mg Intravenous Given 08/22/24 1333)  ondansetron  (ZOFRAN ) injection 4 mg (4 mg Intravenous Given 08/22/24 1332)  lactated ringers  bolus 1,000 mL (1,000 mLs  Intravenous New Bag/Given 08/22/24 1340)  ketorolac  (TORADOL ) 15 MG/ML injection 15 mg (15 mg Intravenous Given 08/22/24 1429)  HYDROmorphone  (DILAUDID ) injection 0.5 mg (0.5 mg Intravenous Given 08/22/24 1430)                                    Medical Decision Making Amount and/or Complexity of Data Reviewed Labs: ordered.  Risk Prescription drug management.   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  He has had continued pain since being discharged.  He states his nausea is well-controlled he is tolerating p.o.  He states that the pain is sharp and seems to be lower flank.  No fever or chills.   Some left CVA tenderness.  Urinalysis unremarkable apart from blood.  Labs reassuring.  Elevated glucose patient states he is taking antihyperglycemic he states he has not taken this in a couple days which is likely why he is hyperglycemic.  Received LR bolus.  Feeling improved after Dilaudid  Toradol  morphine  and Zofran  for nausea.  Patient is feeling well at this time.  Recommend Tylenol , etodolac , prescribed 10 more tablets of oxycodone  quick release.  Follow-up with urology as needed return to ER for pain control or any new or concerning symptom such as fever.   Final diagnoses:  Ureterolithiasis    ED Discharge Orders          Ordered    oxyCODONE  (ROXICODONE ) 5 MG immediate release tablet  Every 6 hours PRN        08/22/24 1525    etodolac  (LODINE ) 200 MG capsule  3 times daily        08/22/24 1525               Neldon Inoue [place], [place] 08/24/24 [id number]    Neysa Caron PARAS, DO 08/28/24 1507

## 2024-08-22 NOTE — ED Triage Notes (Signed)
 53 y/o male comes in c/o ongoing left sided flank pain that started two days ago. Pt was seen here yesterday and diagnosed with a left sided kidney stone. Pt reports the medications aren't working and they told me to come back. Pt speaks Arabic

## 2024-08-22 NOTE — ED Notes (Signed)
 Patient requested delay in IV placement. Patient made aware this in turn delays blood draw and medication administration.
